# Patient Record
Sex: Female | Born: 1960 | Race: White | Hispanic: No | Marital: Married | Smoking: Never smoker
Health system: Southern US, Community
[De-identification: ages and names within clinical notes are randomized; demographics above are authoritative.]

## PROBLEM LIST (undated history)

## (undated) DIAGNOSIS — R918 Other nonspecific abnormal finding of lung field: Secondary | ICD-10-CM

## (undated) DIAGNOSIS — F329 Major depressive disorder, single episode, unspecified: Secondary | ICD-10-CM

## (undated) DIAGNOSIS — F419 Anxiety disorder, unspecified: Secondary | ICD-10-CM

## (undated) DIAGNOSIS — I10 Essential (primary) hypertension: Secondary | ICD-10-CM

## (undated) DIAGNOSIS — E785 Hyperlipidemia, unspecified: Secondary | ICD-10-CM

## (undated) DIAGNOSIS — I2510 Atherosclerotic heart disease of native coronary artery without angina pectoris: Secondary | ICD-10-CM

## (undated) DIAGNOSIS — G894 Chronic pain syndrome: Secondary | ICD-10-CM

## (undated) DIAGNOSIS — F32A Depression, unspecified: Secondary | ICD-10-CM

## (undated) DIAGNOSIS — K219 Gastro-esophageal reflux disease without esophagitis: Secondary | ICD-10-CM

## (undated) DIAGNOSIS — G587 Mononeuritis multiplex: Secondary | ICD-10-CM

## (undated) DIAGNOSIS — I471 Supraventricular tachycardia: Secondary | ICD-10-CM

## (undated) DIAGNOSIS — N200 Calculus of kidney: Secondary | ICD-10-CM

## (undated) DIAGNOSIS — R569 Unspecified convulsions: Secondary | ICD-10-CM

## (undated) DIAGNOSIS — J189 Pneumonia, unspecified organism: Secondary | ICD-10-CM

## (undated) DIAGNOSIS — I4719 Other supraventricular tachycardia: Secondary | ICD-10-CM

## (undated) DIAGNOSIS — E119 Type 2 diabetes mellitus without complications: Secondary | ICD-10-CM

## (undated) HISTORY — DX: Essential (primary) hypertension: I10

## (undated) HISTORY — DX: Mononeuritis multiplex: G58.7

## (undated) HISTORY — DX: Supraventricular tachycardia: I47.1

## (undated) HISTORY — DX: Depression, unspecified: F32.A

## (undated) HISTORY — DX: Calculus of kidney: N20.0

## (undated) HISTORY — DX: Atherosclerotic heart disease of native coronary artery without angina pectoris: I25.10

## (undated) HISTORY — DX: Pneumonia, unspecified organism: J18.9

## (undated) HISTORY — DX: Hyperlipidemia, unspecified: E78.5

## (undated) HISTORY — DX: Type 2 diabetes mellitus without complications: E11.9

## (undated) HISTORY — DX: Gastro-esophageal reflux disease without esophagitis: K21.9

## (undated) HISTORY — PX: OTHER SURGICAL HISTORY: SHX169

## (undated) HISTORY — DX: Chronic pain syndrome: G89.4

## (undated) HISTORY — DX: Other supraventricular tachycardia: I47.19

## (undated) HISTORY — DX: Other nonspecific abnormal finding of lung field: R91.8

## (undated) HISTORY — PX: ABDOMINAL HYSTERECTOMY: SUR658

## (undated) HISTORY — DX: Anxiety disorder, unspecified: F41.9

## (undated) HISTORY — DX: Major depressive disorder, single episode, unspecified: F32.9

## (undated) HISTORY — PX: CHOLECYSTECTOMY: SHX55

---

## 2002-08-18 ENCOUNTER — Emergency Department (HOSPITAL_COMMUNITY): Admission: EM | Admit: 2002-08-18 | Discharge: 2002-08-18 | Payer: Self-pay | Admitting: *Deleted

## 2005-05-22 ENCOUNTER — Ambulatory Visit: Payer: Self-pay | Admitting: Cardiology

## 2005-05-31 ENCOUNTER — Ambulatory Visit: Payer: Self-pay | Admitting: Cardiology

## 2005-06-05 ENCOUNTER — Ambulatory Visit: Payer: Self-pay | Admitting: Cardiology

## 2007-01-15 ENCOUNTER — Ambulatory Visit: Payer: Self-pay | Admitting: Cardiology

## 2007-01-18 ENCOUNTER — Inpatient Hospital Stay (HOSPITAL_COMMUNITY): Admission: EM | Admit: 2007-01-18 | Discharge: 2007-01-21 | Payer: Self-pay | Admitting: Emergency Medicine

## 2007-01-18 ENCOUNTER — Ambulatory Visit: Payer: Self-pay | Admitting: Cardiology

## 2007-02-05 ENCOUNTER — Ambulatory Visit: Payer: Self-pay | Admitting: Cardiology

## 2007-03-31 ENCOUNTER — Ambulatory Visit: Payer: Self-pay | Admitting: Cardiology

## 2007-04-01 ENCOUNTER — Observation Stay (HOSPITAL_COMMUNITY): Admission: AD | Admit: 2007-04-01 | Discharge: 2007-04-02 | Payer: Self-pay | Admitting: Cardiology

## 2007-04-01 ENCOUNTER — Ambulatory Visit: Payer: Self-pay | Admitting: Cardiology

## 2007-04-08 ENCOUNTER — Ambulatory Visit: Payer: Self-pay | Admitting: Cardiology

## 2007-04-14 ENCOUNTER — Ambulatory Visit: Payer: Self-pay | Admitting: Family Medicine

## 2008-07-15 ENCOUNTER — Ambulatory Visit: Payer: Self-pay | Admitting: Cardiology

## 2008-07-16 ENCOUNTER — Encounter: Payer: Self-pay | Admitting: Cardiology

## 2008-08-25 ENCOUNTER — Ambulatory Visit: Payer: Self-pay | Admitting: Cardiology

## 2008-08-31 ENCOUNTER — Ambulatory Visit: Payer: Self-pay | Admitting: Cardiology

## 2008-09-27 ENCOUNTER — Ambulatory Visit: Payer: Self-pay | Admitting: Internal Medicine

## 2008-10-01 ENCOUNTER — Encounter: Payer: Self-pay | Admitting: Internal Medicine

## 2008-10-08 ENCOUNTER — Ambulatory Visit: Payer: Self-pay | Admitting: Internal Medicine

## 2008-10-08 ENCOUNTER — Ambulatory Visit (HOSPITAL_COMMUNITY): Admission: RE | Admit: 2008-10-08 | Discharge: 2008-10-08 | Payer: Self-pay | Admitting: Internal Medicine

## 2008-11-05 ENCOUNTER — Ambulatory Visit: Payer: Self-pay | Admitting: Internal Medicine

## 2008-11-05 ENCOUNTER — Encounter: Payer: Self-pay | Admitting: Internal Medicine

## 2008-11-05 DIAGNOSIS — I471 Supraventricular tachycardia: Secondary | ICD-10-CM

## 2008-11-05 DIAGNOSIS — I2510 Atherosclerotic heart disease of native coronary artery without angina pectoris: Secondary | ICD-10-CM

## 2008-11-25 DIAGNOSIS — I498 Other specified cardiac arrhythmias: Secondary | ICD-10-CM | POA: Insufficient documentation

## 2008-11-26 ENCOUNTER — Encounter: Payer: Self-pay | Admitting: Internal Medicine

## 2008-11-26 ENCOUNTER — Ambulatory Visit: Payer: Self-pay | Admitting: Internal Medicine

## 2009-01-06 ENCOUNTER — Encounter: Payer: Self-pay | Admitting: Cardiology

## 2009-01-18 ENCOUNTER — Ambulatory Visit: Payer: Self-pay | Admitting: Cardiology

## 2009-02-10 ENCOUNTER — Ambulatory Visit: Payer: Self-pay | Admitting: Internal Medicine

## 2009-02-10 ENCOUNTER — Inpatient Hospital Stay (HOSPITAL_COMMUNITY): Admission: EM | Admit: 2009-02-10 | Discharge: 2009-02-12 | Payer: Self-pay | Admitting: Emergency Medicine

## 2009-02-19 ENCOUNTER — Ambulatory Visit: Payer: Self-pay | Admitting: Cardiovascular Disease

## 2009-02-20 ENCOUNTER — Inpatient Hospital Stay (HOSPITAL_COMMUNITY): Admission: EM | Admit: 2009-02-20 | Discharge: 2009-02-21 | Payer: Self-pay | Admitting: Emergency Medicine

## 2009-02-21 ENCOUNTER — Encounter: Payer: Self-pay | Admitting: Cardiology

## 2009-03-04 ENCOUNTER — Telehealth: Payer: Self-pay | Admitting: Internal Medicine

## 2009-03-05 ENCOUNTER — Telehealth (INDEPENDENT_AMBULATORY_CARE_PROVIDER_SITE_OTHER): Payer: Self-pay | Admitting: Physician Assistant

## 2009-03-09 ENCOUNTER — Encounter: Payer: Self-pay | Admitting: Physician Assistant

## 2009-03-09 ENCOUNTER — Ambulatory Visit: Payer: Self-pay | Admitting: Cardiology

## 2009-03-16 ENCOUNTER — Telehealth: Payer: Self-pay | Admitting: Internal Medicine

## 2009-03-23 ENCOUNTER — Ambulatory Visit: Payer: Self-pay | Admitting: Internal Medicine

## 2009-04-01 ENCOUNTER — Telehealth: Payer: Self-pay | Admitting: Internal Medicine

## 2009-04-10 ENCOUNTER — Ambulatory Visit: Payer: Self-pay | Admitting: Internal Medicine

## 2009-04-10 ENCOUNTER — Inpatient Hospital Stay (HOSPITAL_COMMUNITY): Admission: EM | Admit: 2009-04-10 | Discharge: 2009-04-12 | Payer: Self-pay | Admitting: Emergency Medicine

## 2009-05-04 ENCOUNTER — Encounter (INDEPENDENT_AMBULATORY_CARE_PROVIDER_SITE_OTHER): Payer: Self-pay | Admitting: *Deleted

## 2009-05-06 ENCOUNTER — Encounter: Payer: Self-pay | Admitting: Physician Assistant

## 2009-05-06 ENCOUNTER — Ambulatory Visit: Payer: Self-pay | Admitting: Cardiology

## 2009-05-06 DIAGNOSIS — R0789 Other chest pain: Secondary | ICD-10-CM | POA: Insufficient documentation

## 2009-05-06 DIAGNOSIS — E785 Hyperlipidemia, unspecified: Secondary | ICD-10-CM

## 2009-07-18 ENCOUNTER — Telehealth (INDEPENDENT_AMBULATORY_CARE_PROVIDER_SITE_OTHER): Payer: Self-pay | Admitting: *Deleted

## 2009-07-19 ENCOUNTER — Ambulatory Visit: Payer: Self-pay | Admitting: Cardiology

## 2009-07-20 ENCOUNTER — Ambulatory Visit: Payer: Self-pay | Admitting: Cardiology

## 2009-07-20 ENCOUNTER — Inpatient Hospital Stay (HOSPITAL_COMMUNITY): Admission: AD | Admit: 2009-07-20 | Discharge: 2009-07-22 | Payer: Self-pay | Admitting: Cardiology

## 2009-07-20 ENCOUNTER — Encounter: Payer: Self-pay | Admitting: Cardiology

## 2009-07-21 ENCOUNTER — Encounter: Payer: Self-pay | Admitting: Cardiology

## 2009-07-22 ENCOUNTER — Encounter: Payer: Self-pay | Admitting: Cardiology

## 2009-08-17 ENCOUNTER — Ambulatory Visit: Payer: Self-pay | Admitting: Cardiology

## 2009-08-17 DIAGNOSIS — E109 Type 1 diabetes mellitus without complications: Secondary | ICD-10-CM | POA: Insufficient documentation

## 2009-08-22 ENCOUNTER — Ambulatory Visit: Payer: Self-pay | Admitting: Internal Medicine

## 2009-12-09 ENCOUNTER — Encounter: Payer: Self-pay | Admitting: Internal Medicine

## 2009-12-13 ENCOUNTER — Encounter: Payer: Self-pay | Admitting: Internal Medicine

## 2009-12-15 ENCOUNTER — Encounter: Payer: Self-pay | Admitting: Internal Medicine

## 2009-12-20 ENCOUNTER — Ambulatory Visit: Payer: Self-pay | Admitting: Cardiology

## 2009-12-20 ENCOUNTER — Encounter: Payer: Self-pay | Admitting: Cardiology

## 2010-02-23 ENCOUNTER — Ambulatory Visit: Payer: Self-pay | Admitting: Cardiology

## 2010-03-03 ENCOUNTER — Encounter: Payer: Self-pay | Admitting: Cardiology

## 2010-03-13 ENCOUNTER — Encounter (INDEPENDENT_AMBULATORY_CARE_PROVIDER_SITE_OTHER): Payer: Self-pay | Admitting: *Deleted

## 2010-03-23 ENCOUNTER — Ambulatory Visit: Payer: Self-pay | Admitting: Internal Medicine

## 2010-04-10 ENCOUNTER — Emergency Department (HOSPITAL_COMMUNITY): Admission: EM | Admit: 2010-04-10 | Discharge: 2010-04-10 | Payer: Self-pay | Admitting: Emergency Medicine

## 2010-05-09 IMAGING — CR DG CHEST 2V
2 series · 2 of 2 positions shown · non-contrast
Comparison: 01/18/2007

CLINICAL DATA: Left chest and arm pain, history coronary artery
stent

CHEST - 2 VIEW

[w chest pa]
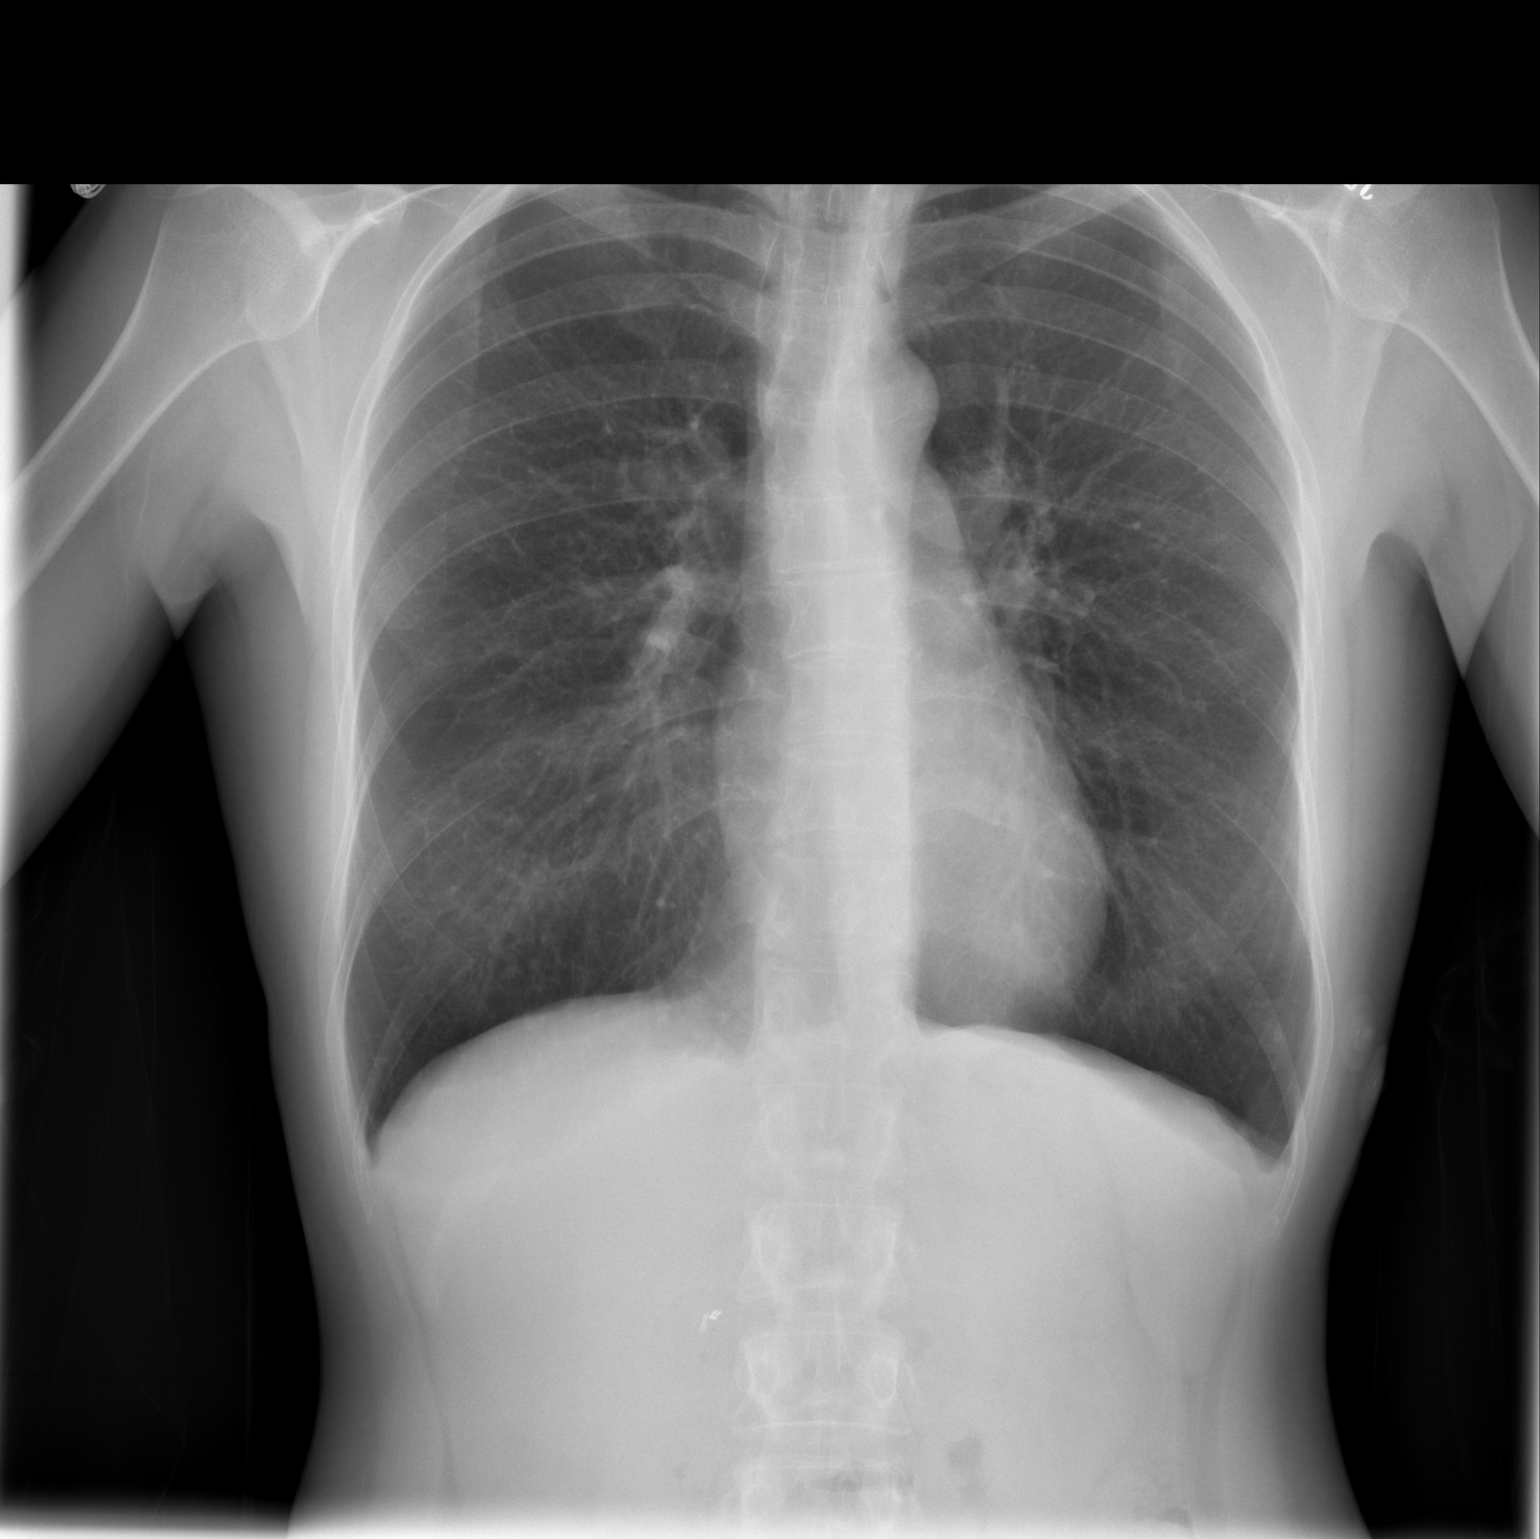

[w chest lat]
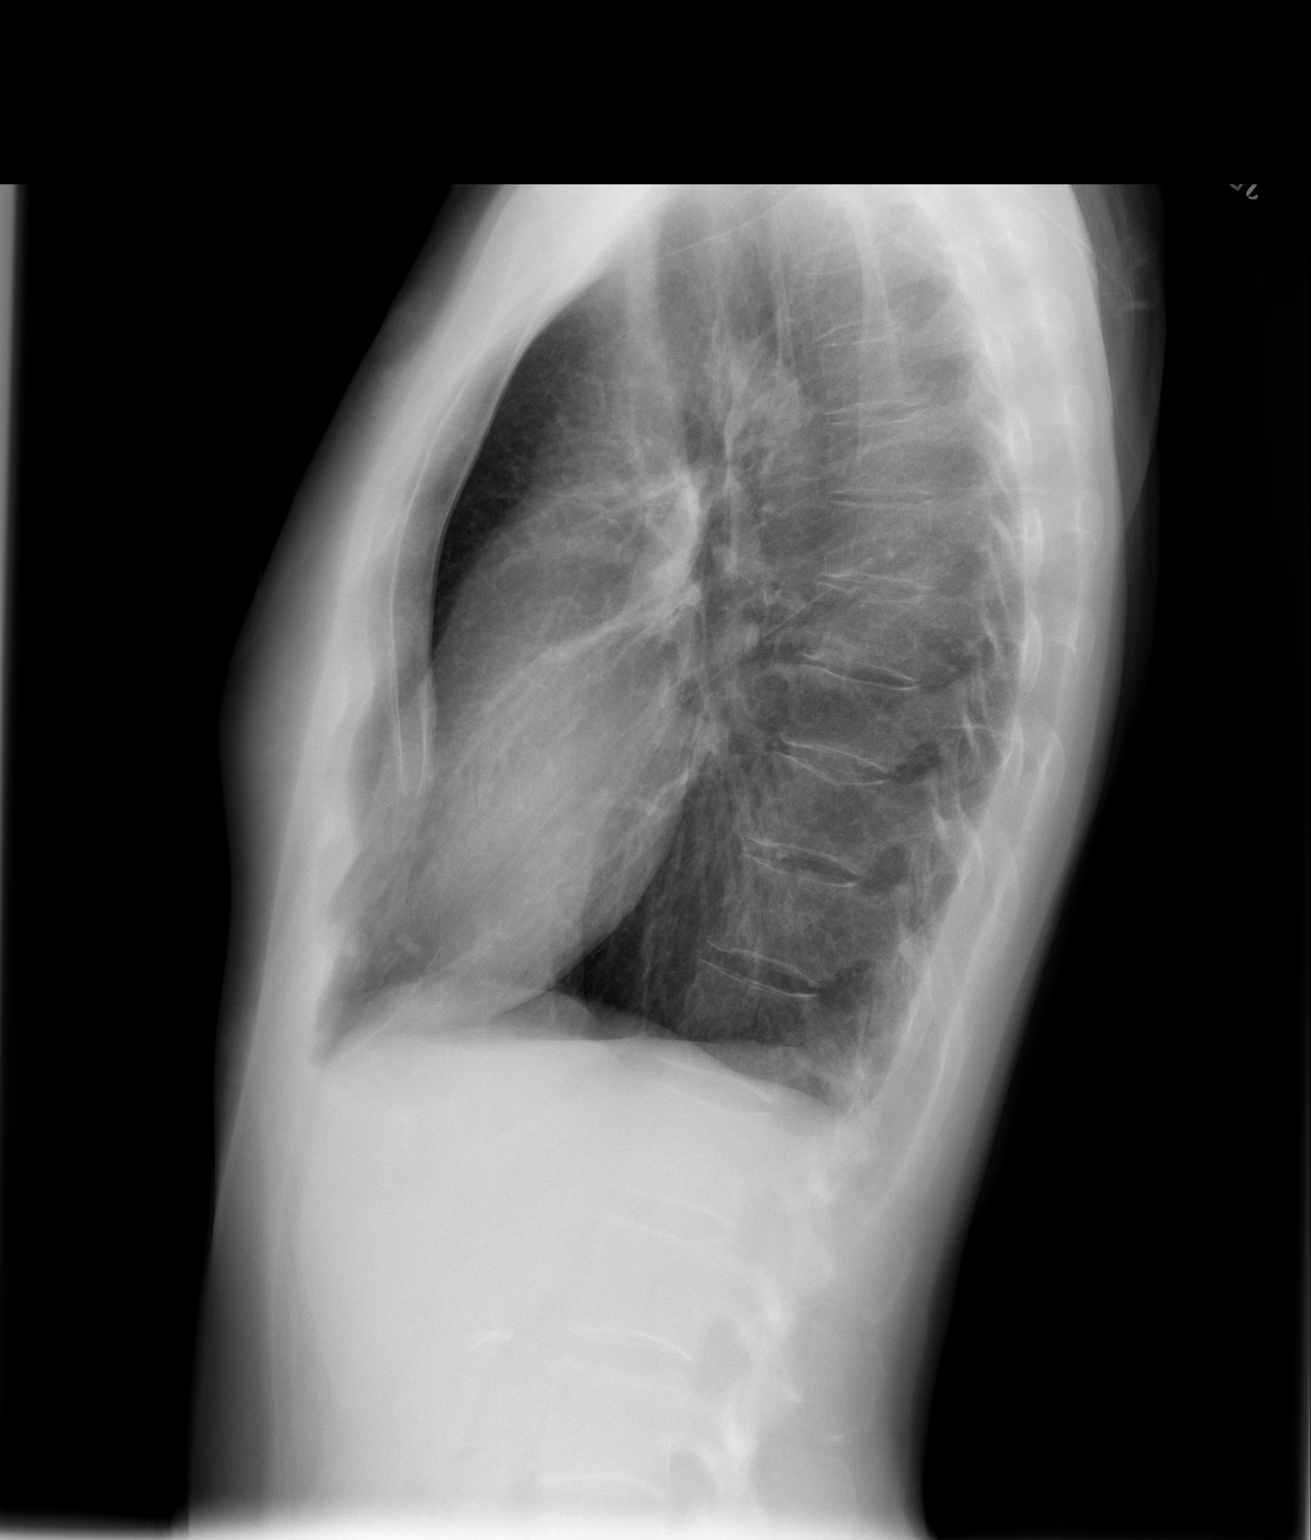

[2 of 2 positions shown; findings below may reference images not displayed]

FINDINGS: Normal heart size, mediastinal contours, and pulmonary vascularity.
Mild emphysematous changes without infiltrate or effusion.
No pneumothorax.
Question mild bony demineralization.
Faint nodular foci at the upper lobes and left base correspond to
EKG leads.
IMPRESSION: Mild emphysematous changes.
No acute abnormalities.

## 2010-06-05 ENCOUNTER — Ambulatory Visit: Payer: Self-pay | Admitting: Internal Medicine

## 2010-07-29 ENCOUNTER — Encounter: Payer: Self-pay | Admitting: Internal Medicine

## 2010-07-30 ENCOUNTER — Encounter: Payer: Self-pay | Admitting: Internal Medicine

## 2010-07-30 ENCOUNTER — Encounter: Payer: Self-pay | Admitting: Cardiology

## 2010-08-01 ENCOUNTER — Ambulatory Visit: Payer: Self-pay | Admitting: Cardiology

## 2010-08-01 ENCOUNTER — Encounter: Payer: Self-pay | Admitting: Internal Medicine

## 2010-08-02 ENCOUNTER — Encounter: Payer: Self-pay | Admitting: Internal Medicine

## 2010-08-03 ENCOUNTER — Encounter: Payer: Self-pay | Admitting: Internal Medicine

## 2010-08-09 ENCOUNTER — Encounter: Payer: Self-pay | Admitting: Internal Medicine

## 2010-10-17 NOTE — Procedures (Signed)
Summary: Event  Event   Imported By: Marylou Mccoy 04/11/2010 12:01:41  _____________________________________________________________________  External Attachment:    Type:   Image     Comment:   External Document

## 2010-10-17 NOTE — Consult Note (Signed)
Summary: CARDIOLOGY CONSULT/ [facility]  CARDIOLOGY CONSULT/ [facility]   Imported By: Zachary George 02/23/2010 15:37:33  _____________________________________________________________________  External Attachment:    Type:   Image     Comment:   External Document

## 2010-10-17 NOTE — Assessment & Plan Note (Signed)
Summary: 6 month fu-recv reminder-vs   Visit Type:  Follow-up Primary Provider:  Dr Doreen Beam   History of Present Illness: 50 year old woman presents for follow-up.  Since last seen in the office, she was seen in consultation by Dr. Myrtis Ser at [facility] back in April with chest pain.  She ruled out for MI at that time and was menaged medically.  Labs from April showed BUN 14, creatinine 0.9, potassium 3.8, normal troponin levels, hemoglobin 13.9.  CXR revealed evidence of COPD without acute changes.  She denies any problems with anginal chest pain. She does report an increase in her palpitations over the last several weeks, sometimes using Cardizem 30 mg p.o. q.6 hours up to 3 times a day. She's had no obvious dizziness or clear systematic bradycardia. Today's heart rate and electrocardiogram are reviewed below.  I reviewed her medications including present simvastatin dose with Cardizem. This will be reduced, with followup lipid profile and liver function tests.  Preventive Screening-Counseling & Management  Alcohol-Tobacco     Smoking Status: never  Current Medications (verified): 1)  Simvastatin 40 Mg Tabs (Simvastatin) .... Take One Tablet By Mouth Daily At Bedtime 2)  Aspirin 81 Mg Tbec (Aspirin) .... Take One Tablet By Mouth Daily 3)  Prilosec 20 Mg Cpdr (Omeprazole) .... Take 1 Capsule By Mouth Once A Day 4)  Humulin 70/30 70-30 % Susp (Insulin Isophane & Reg (Human)) .... Inject 20 Units Subcutaneously Each Am and 10 Units Each Pm 5)  Cardizem 30 Mg Tabs (Diltiazem Hcl) .... As Needed Palpitations 6)  Nitrostat 0.4 Mg Subl (Nitroglycerin) .... Place 1 Tablet Under Tongue As Directed 7)  Plavix 75 Mg Tabs (Clopidogrel Bisulfate) .... Take One Tablet By Mouth Daily 8)  Celexa 40 Mg Tabs (Citalopram Hydrobromide) .... Take 1 Tablet By Mouth Once A Day 9)  Gabapentin 300 Mg Caps (Gabapentin) .... Take 1 Tablet By Mouth Two Times A Day 10)  Ambien 5 Mg Tabs (Zolpidem Tartrate) .... Take 1  Tablet By Mouth Once A Day At Bedtime 11)  Percocet 7.5-325 Mg Tabs (Oxycodone-Acetaminophen) .... Take 1 Tablet By Mouth Three Times A Day As Needed  Allergies (verified): 1)  ! Ampicillin 2)  ! * No Ivp Dye Allergy 3)  ! * No Shellfish Allergy 4)  ! * No Latex Allergy  Comments:  Nurse/Medical Assistant: The patient's medication list and allergies were reviewed with the patient and were updated in the Medication and Allergy Lists.  Past History:  Social History: Last updated: 02/23/2010 Lives in [place]  She has two children Disabled  Married  Tobacco Use - No Alcohol Use - no  Past Medical History: CAD - DES mid RCA 5/08 and DES mid RCA 5/10, LVEF normal Chronic pain syndrome Mononeuritis multiplex Atrial tachycardia and history of bradycardia, on long-acting diltiazem G E R D Bilateral nephrolithiasis Bilateral pulmonary nodules Pneumonia Diabetes Type 2 Hyperlipidemia  Past Surgical History: Cholecystectomy Total hysterectomy Right knee surgery  Family History: Father: died with COPD Mother: died in her 81's with MI Siblings: no CAD  Social History: Lives in [place]  She has two children Disabled  Married  Tobacco Use - No Alcohol Use - no  Review of Systems       The patient complains of dyspnea on exertion.  The patient denies anorexia, fever, chest pain, syncope, peripheral edema, prolonged cough, headaches, melena, and hematochezia.         Otherwise reviewed and negative.  Vital Signs:  Patient profile:  50 year old female Height:      67 inches Weight:      114 pounds Pulse rate:   60 / minute BP sitting:   111 / 68  (left arm) Cuff size:   regular  Vitals Entered By: Carlye Grippe (February 23, 2010 1:40 PM)  Physical Exam  Additional Exam:  Thin woman in no acute distress. HEENT: Conjunctiva and lids normal, oropharynx clear with poor dentition. Neck: Supple, no elevated JVP or bruits. Lungs: Clear to auscultation,  diminished breath sounds, nonlabored. Cardiac: Regular rate and rhythm, no S3 loud systolic murmur. Abdomen: Soft, nontender, bowel sounds present. Skin: Tan, warm and dry. Extremities: No pitting edema, distal pulses are 1-2+. Musculoskeletal: No kyphosis. Neuropsychiatric: Alert and oriented x3, affect grossly appropriate.   Cardiac Cath  Procedure date:  07/21/2009  Findings:      HEMODYNAMIC DATA:   1. The central aortic pressure was 119/57.  The mean was 82.   2. Left ventricular pressure was 135/6.   3. There was no gradient or pullback across the aortic valve.      ANGIOGRAPHIC DATA:   1. Ventriculography done in the RAO projection reveals preserved       global systolic function.  No segmental abnormalities, contraction       were identified.   2. The right coronary artery is a small to moderate sized vessel,       provides a single PDA and a very small posterolateral system.  The       right coronary artery demonstrates a previously placed stent with a       second stent.  Overlapping within the course of the stent, there       was less than 10% narrowing and no high-grade stenosis.   3. The left main is free of critical disease.   4. The LAD courses to the apex and provides a diagonal branch of the       LAD and its branches are free of significant disease.   5. The circumflex is a moderate-sized vessel.  There is minimal ostial       irregularity, but nothing that is high grade.  The remainder of the       vessel is without significant narrowing.  EKG  Procedure date:  02/23/2010  Findings:      Sinus bradycardia 56 beats per minute, possible right atrial enlargement.  Impression & Recommendations:  Problem # 1:  ATRIAL TACHYCARDIA (ICD-427.89)  Patient reports increasing palpitations over the last several weeks, using p.r.n. Cardizem as noted above more frequently, sometimes 3 times a day. Her electrocardiogram today is stable however with sinus bradycardia.  She is due for a followup visit with Dr. Johney Frame in the next few weeks. May need to consider either placing her on a low-dose long-acting Cardizem formulation, versus potentially an antiarrhythmic.  Her updated medication list for this problem includes:    Aspirin 81 Mg Tbec (Aspirin) .Marland Kitchen... Take one tablet by mouth daily    Cardizem 30 Mg Tabs (Diltiazem hcl) .Marland Kitchen... As needed palpitations    Nitrostat 0.4 Mg Subl (Nitroglycerin) .Marland Kitchen... Place 1 tablet under tongue as directed    Plavix 75 Mg Tabs (Clopidogrel bisulfate) .Marland Kitchen... Take one tablet by mouth daily  Orders: EKG w/ Interpretation (93000)  Problem # 2:  CAD, NATIVE VESSEL (ICD-414.01)  No recent anginal chest pain reported. She did have a hospital admission back in April at which time she ruled out for myocardial  infarction. No change in antianginal regimen. I will see her back in 6 months.  Her updated medication list for this problem includes:    Aspirin 81 Mg Tbec (Aspirin) .Marland Kitchen... Take one tablet by mouth daily    Cardizem 30 Mg Tabs (Diltiazem hcl) .Marland Kitchen... As needed palpitations    Nitrostat 0.4 Mg Subl (Nitroglycerin) .Marland Kitchen... Place 1 tablet under tongue as directed    Plavix 75 Mg Tabs (Clopidogrel bisulfate) .Marland Kitchen... Take one tablet by mouth daily  Orders: EKG w/ Interpretation (93000)  Problem # 3:  DYSLIPIDEMIA (ICD-272.4)  Plan followup fasting lipid profile and liver function tests. Will reduce simvastatin to 10 mg daily in light of increasing need for Cardizem. Plan followup labs around the time of her next visit.  Her updated medication list for this problem includes:    Simvastatin 10 Mg Tabs (Simvastatin) .Marland Kitchen... Take one tablet by mouth daily at bedtime  Orders: T-Lipid Profile ([id number]) T-Hepatic Function [phone])  Patient Instructions: 1)  Your physician wants you to follow-up in: 6 months. You will receive a reminder letter in the mail one-two months in advance. If you don't receive a letter, please call our  office to schedule the follow-up appointment. 2)  Decrease Simvastatin (Zocor) to 10mg  by mouth at bedtime. 3)  Your physician recommends that you go to the [facility] for a FASTING lipid profile and liver function labs. Do not eat or drink after midnight. If the results of your test are normal or stable, you will receive a letter. If they are abnormal, the nurse will contact you by phone. 4)  Keep appt with Dr. Johney Frame on Thursday, July 7th at [facility] in [place]. Prescriptions: SIMVASTATIN 10 MG TABS (SIMVASTATIN) Take one tablet by mouth daily at bedtime  #30 x 6   Entered by:   Cyril Loosen, RN, BSN   Authorized by:   Loreli Slot, MD, [facility]   Signed by:   Cyril Loosen, RN, BSN on 02/23/2010   Method used:   Electronically to        [facility]  [address] (retail)       [address]       [place], [place]  [address]       Ph: [phone]       Fax: [phone]   RxID:   [id number]

## 2010-10-17 NOTE — Procedures (Signed)
Summary: Event  Event   Imported By: Marylou Mccoy 04/11/2010 11:53:30  _____________________________________________________________________  External Attachment:    Type:   Image     Comment:   External Document

## 2010-10-17 NOTE — Procedures (Signed)
Summary: CardioNet/Event monitoring  CardioNet/Event monitoring   Imported By: Erle Crocker 04/12/2010 08:43:28  _____________________________________________________________________  External Attachment:    Type:   Image     Comment:   External Document

## 2010-10-17 NOTE — Letter (Signed)
Summary: Engineer, materials at [facility]  [address]   [place], [place] [address]   Phone: [phone]  Fax: [phone]        March 13, 2010 MRN: [id number]    [facility] [address] [place], [place]  [address]    Dear Ms. Boulet,  Your test ordered by Selena Batten has been reviewed by your physician (or physician assistant) and was found to be normal or stable. Your physician (or physician assistant) felt no changes were needed at this time.  ____ Echocardiogram  ____ Cardiac Stress Test  __X__ Lab Work  ____ Peripheral vascular study of arms, legs or neck  ____ CT scan or X-ray  ____ Lung or Breathing test  ____ Other:   Thank you.   Cyril Loosen, RN, BSN    Duane Boston, M.D., F.A.C.C. Thressa Sheller, M.D., F.A.C.C. Oneal Grout, M.D., F.A.C.C. Cheree Ditto, M.D., F.A.C.C. Daiva Nakayama, M.D., F.A.C.C. Kenney Houseman, M.D., F.A.C.C. Jeanne Ivan, PA-C

## 2010-10-17 NOTE — Assessment & Plan Note (Signed)
Summary: per check out/sf   Visit Type:  Follow-up Referring Provider:  Dr Simona Huh Primary Provider:  Dr Doreen Beam  CC:  palpitations.  History of Present Illness: The patient presents today for routine electrophysiology followup. She is primarily concerned with social stress and anxiety today.  She and her husband continue to have marital problems.  She also reports that her brother lives with them and she cannot "evict" him.  She has had to call the police to try to have him removed from the house. She reports episodes of anxiety with chest tightness and heart racing.  She finds that these episodes occur several times per week.  She reports gradual onset and termination of her symptoms.  She finds some relief with as needed cardizem. The patient denies symptoms of shortness of breath, orthopnea, PND, lower extremity edema, dizziness, presyncope, syncope, or neurologic sequela. The patient is tolerating medications without difficulties and is otherwise without complaint today.   Current Medications (verified): 1)  Simvastatin 10 Mg Tabs (Simvastatin) .... Take One Tablet By Mouth Daily At Bedtime 2)  Aspirin 81 Mg Tbec (Aspirin) .... Take One Tablet By Mouth Daily 3)  Prilosec 20 Mg Cpdr (Omeprazole) .... Take 1 Capsule By Mouth Once A Day 4)  Humulin 70/30 70-30 % Susp (Insulin Isophane & Reg (Human)) .... Inject 20 Units Subcutaneously Each Am and 10 Units Each Pm 5)  Cardizem 30 Mg Tabs (Diltiazem Hcl) .... As Needed Palpitations 6)  Nitrostat 0.4 Mg Subl (Nitroglycerin) .... Place 1 Tablet Under Tongue As Directed 7)  Plavix 75 Mg Tabs (Clopidogrel Bisulfate) .... Take One Tablet By Mouth Daily 8)  Celexa 40 Mg Tabs (Citalopram Hydrobromide) .... Take 1 Tablet By Mouth Once A Day 9)  Gabapentin 300 Mg Caps (Gabapentin) .... Take 1 Tablet By Mouth Two Times A Day 10)  Ambien 5 Mg Tabs (Zolpidem Tartrate) .... Take 1 Tablet By Mouth Once A Day At Bedtime 11)  Percocet 7.5-325 Mg  Tabs (Oxycodone-Acetaminophen) .... Take 1 Tablet By Mouth Three Times A Day As Needed  Allergies: 1)  ! Ampicillin  Past History:  Past Medical History: CAD - DES mid RCA 5/08 and DES mid RCA 5/10, LVEF normal Chronic pain syndrome Mononeuritis multiplex Atrial tachycardia and history of bradycardia, on long-acting diltiazem G E R D Bilateral nephrolithiasis Bilateral pulmonary nodules Pneumonia Diabetes Type 2 Hyperlipidemia Anxiety  Past Surgical History: Reviewed history from 02/23/2010 and no changes required. Cholecystectomy Total hysterectomy Right knee surgery  Social History: Reviewed history from 02/23/2010 and no changes required. Lives in [place]  She has two children Disabled  Married  Tobacco Use - No Alcohol Use - no  Review of Systems       All systems are reviewed and negative except as listed in the HPI.   Vital Signs:  Patient profile:   50 year old female Height:      67 inches Weight:      117.25 pounds BMI:     18.43 Pulse rate:   53 / minute Pulse rhythm:   regular Resp:     18 per minute BP sitting:   118 / 60  (left arm) Cuff size:   large  Vitals Entered By: Vikki Ports (March 23, 2010 11:10 AM)  Physical Exam  General:  anxious, NAD Head:  normocephalic and atraumatic Eyes:  PERRLA/EOM intact; conjunctiva and lids normal. Mouth:  Teeth, gums and palate normal. Oral mucosa normal. Neck:  Neck supple, no JVD. No  masses, thyromegaly or abnormal cervical nodes. Lungs:  Clear bilaterally to auscultation and percussion. Heart:  Non-displaced PMI, chest non-tender; regular rate and rhythm, S1, S2 without murmurs, rubs or gallops. Carotid upstroke normal, no bruit. Normal abdominal aortic size, no bruits. Femorals normal pulses, no bruits. Pedals normal pulses. No edema, no varicosities. Abdomen:  Bowel sounds positive; abdomen soft and non-tender without masses, organomegaly, or hernias noted. No hepatosplenomegaly. Msk:  Back  normal, normal gait. Muscle strength and tone normal. Pulses:  pulses normal in all 4 extremities Extremities:  No clubbing or cyanosis. Neurologic:  Alert and oriented x 3.   EKG  Procedure date:  03/23/2010  Findings:      sinus bradycardia 53 bpm, PR 120, Qtc , otherwise normal ekg  EKG  Procedure date:  12/08/2009  Findings:      The patient wore a cardioNet monitor 12/08/09-12/22/09 which documented predominantly sinus rhythm and sinus bradycardia.  I have reviewed the portion of the monitor faxed from Dr Sherril Croon' office to our office.  I do not see any sustained SVT.  She did have several short runs (4-5 beats) of nonsustained atrial tachycardia.  Impression & Recommendations:  Problem # 1:  ATRIAL TACHYCARDIA (ICD-427.89) The patient continues to have palpitations and chest tightness.  I suspect that some of these episodes are sinus mechanism and related to anxiety/ psychosocial stress.  She most recently had an event monitor placed by Dr Sherril Croon which documented sinus rhythm and sinus bradycardia.  Upon the review of the portion of the monitor faxed to our office, I did not see any sustained SVT.  She did have short (3-4 beats) of nonsustained atrial tachycardia similar to her EP study.  Unfortunately, these nonsustained episodes are not amenable to mapping and ablation. Given her bradycardia, I have not started daily beta blockers or calcium channel blockers.  She continues to find some relief with as needed cardizem. We will consider either multaq, sotalol, or tikosyn if she has documented worsening and prolongation of SVT.  Otherwise, I would propose that we continue our present strategy.  Problem # 2:  CAD, NATIVE VESSEL (ICD-414.01) stable no changes  Problem # 3:  BRADYCARDIA (ICD-427.89) asymptomatic  no changes today  Patient Instructions: 1)  no medicine changes today

## 2010-10-17 NOTE — Assessment & Plan Note (Signed)
Summary: f61m/ gd   Visit Type:  Follow-up Referring Provider:  Dr Simona Huh Primary Provider:  Dr Doreen Beam   History of Present Illness: The patient presents today for routine electrophysiology followup. She reports doing very well since last being seen in our clinic, this is mostly due to improvements in psychosocial stress improvements related to her husband. The patient denies symptoms of shortness of breath, orthopnea, PND, lower extremity edema, presyncope, syncope, or neurologic sequela.  She reports rare episodes of dizziness at night when going to the bathroom.  She also feels that her palpitations and chest pain are currently well controlled.  The patient is tolerating medications without difficulties and is otherwise without complaint today.   Current Medications (verified): 1)  Simvastatin 10 Mg Tabs (Simvastatin) .... Take One Tablet By Mouth Daily At Bedtime 2)  Aspirin 81 Mg Tbec (Aspirin) .... Take One Tablet By Mouth Daily 3)  Prilosec 20 Mg Cpdr (Omeprazole) .... Take 1 Capsule By Mouth Once A Day 4)  Humulin 70/30 70-30 % Susp (Insulin Isophane & Reg (Human)) .... Inject 20 Units Subcutaneously Each Am and 10 Units Each Pm 5)  Cardizem 30 Mg Tabs (Diltiazem Hcl) .... As Needed Palpitations 6)  Nitrostat 0.4 Mg Subl (Nitroglycerin) .... Place 1 Tablet Under Tongue As Directed 7)  Plavix 75 Mg Tabs (Clopidogrel Bisulfate) .... Take One Tablet By Mouth Daily 8)  Celexa 20 Mg Tabs (Citalopram Hydrobromide) .... Take One Tablet By Mouth Once Daily. 9)  Gabapentin 300 Mg Caps (Gabapentin) .... Take 1 Tablet By Mouth Two Times A Day 10)  Ambien 5 Mg Tabs (Zolpidem Tartrate) .... Take 1 Tablet By Mouth Once A Day At Bedtime 11)  Atrovent Hfa 17 Mcg/act Aers (Ipratropium Bromide Hfa) .... Uad 12)  Tizanidine Hcl 2 Mg Tabs (Tizanidine Hcl) .... Two Times A Day  Allergies: 1)  ! Ampicillin  Past History:  Past Medical History: Reviewed history from 03/23/2010 and no changes  required. CAD - DES mid RCA 5/08 and DES mid RCA 5/10, LVEF normal Chronic pain syndrome Mononeuritis multiplex Atrial tachycardia and history of bradycardia, on long-acting diltiazem G E R D Bilateral nephrolithiasis Bilateral pulmonary nodules Pneumonia Diabetes Type 2 Hyperlipidemia Anxiety  Past Surgical History: Reviewed history from 02/23/2010 and no changes required. Cholecystectomy Total hysterectomy Right knee surgery  Social History: Reviewed history from 02/23/2010 and no changes required. Lives in [place]  She has two children Disabled  Married  Tobacco Use - No Alcohol Use - no  Review of Systems       All systems are reviewed and negative except as listed in the HPI.   Vital Signs:  Patient profile:   50 year old female Height:      67 inches Weight:      120 pounds BMI:     18.86 Pulse rate:   51 / minute BP sitting:   122 / 70  (left arm)  Vitals Entered By: Laurance Flatten CMA (June 05, 2010 10:39 AM)  Physical Exam  General:  NAD, flat affect today Head:  normocephalic and atraumatic Eyes:  PERRLA/EOM intact; conjunctiva and lids normal. Mouth:  Teeth, gums and palate normal. Oral mucosa normal. Neck:  Neck supple, no JVD. No masses, thyromegaly or abnormal cervical nodes. Lungs:  Clear bilaterally to auscultation and percussion. Heart:  Non-displaced PMI, chest non-tender; regular rate and rhythm, S1, S2 without murmurs, rubs or gallops. Carotid upstroke normal, no bruit. Normal abdominal aortic size, no bruits. Femorals normal pulses,  no bruits. Pedals normal pulses. No edema, no varicosities. Abdomen:  Bowel sounds positive; abdomen soft and non-tender without masses, organomegaly, or hernias noted. No hepatosplenomegaly. Msk:  Back normal, normal gait. Muscle strength and tone normal. Pulses:  pulses normal in all 4 extremities Extremities:  No clubbing or cyanosis. Neurologic:  Alert and oriented x 3. Skin:  Intact without lesions or  rashes. Psych:  flat affect   Impression & Recommendations:  Problem # 1:  ATRIAL TACHYCARDIA (ICD-427.89) SVT is improved/ stable with as needed cardizem most of her symptoms of palpitations on a recent event monitor obtained by Dr Sherril Croon were sinus rhythm. no changes today  Problem # 2:  CAD, NATIVE VESSEL (ICD-414.01) stable no changes   Problem # 3:  BRADYCARDIA (ICD-427.89) asymptomatic  Problem # 4:  CHEST PAIN, ATYPICAL (ICD-786.59) improved  Patient Instructions: 1)  Your physician recommends that you schedule a follow-up appointment in: 6 months . 2)  Your physician recommends that you continue on your current medications as directed. Please refer to the Current Medication list given to you today.

## 2010-10-30 ENCOUNTER — Other Ambulatory Visit (INDEPENDENT_AMBULATORY_CARE_PROVIDER_SITE_OTHER): Payer: Self-pay | Admitting: Internal Medicine

## 2010-10-30 ENCOUNTER — Ambulatory Visit (INDEPENDENT_AMBULATORY_CARE_PROVIDER_SITE_OTHER): Payer: Medicaid Other | Admitting: Internal Medicine

## 2010-10-30 DIAGNOSIS — R1310 Dysphagia, unspecified: Secondary | ICD-10-CM

## 2010-10-30 DIAGNOSIS — Z7901 Long term (current) use of anticoagulants: Secondary | ICD-10-CM

## 2010-10-30 DIAGNOSIS — Z7982 Long term (current) use of aspirin: Secondary | ICD-10-CM

## 2010-11-01 ENCOUNTER — Encounter (HOSPITAL_COMMUNITY): Payer: Self-pay

## 2010-11-01 ENCOUNTER — Ambulatory Visit (HOSPITAL_COMMUNITY)
Admission: RE | Admit: 2010-11-01 | Discharge: 2010-11-01 | Disposition: A | Discharge status: Home / Self Care | Payer: Medicaid Other | Source: Ambulatory Visit | Attending: Internal Medicine | Admitting: Internal Medicine

## 2010-11-01 DIAGNOSIS — R1310 Dysphagia, unspecified: Secondary | ICD-10-CM | POA: Insufficient documentation

## 2010-11-01 HISTORY — DX: Unspecified convulsions: R56.9

## 2010-11-20 ENCOUNTER — Encounter: Payer: Self-pay | Admitting: Internal Medicine

## 2010-11-20 ENCOUNTER — Ambulatory Visit (INDEPENDENT_AMBULATORY_CARE_PROVIDER_SITE_OTHER): Payer: Medicaid Other | Admitting: Internal Medicine

## 2010-11-20 DIAGNOSIS — I471 Supraventricular tachycardia, unspecified: Secondary | ICD-10-CM

## 2010-11-20 DIAGNOSIS — I2510 Atherosclerotic heart disease of native coronary artery without angina pectoris: Secondary | ICD-10-CM

## 2010-11-28 NOTE — Consult Note (Signed)
Summary: PULMONARY CONSULT/ DR. Ninetta Lights  PULMONARY CONSULT/ DR. Ninetta Lights   Imported By: Zachary George 11/20/2010 11:23:07  _____________________________________________________________________  External Attachment:    Type:   Image     Comment:   External Document

## 2010-11-28 NOTE — Letter (Signed)
Summary: Discharge [facility]  Discharge [facility]   Imported By: Dorise Hiss 11/20/2010 11:10:19  _____________________________________________________________________  External Attachment:    Type:   Image     Comment:   External Document

## 2010-11-28 NOTE — Consult Note (Signed)
Summary: CARDIOLOGY CONSULT/ [facility]  CARD[facility]OLOGY CONSULT/ [facility]   Imported By: Zachary George 11/20/2010 11:19:16  _____________________________________________________________________  External Attachment:    Type:   Image     Comment:   External Document

## 2010-11-28 NOTE — Assessment & Plan Note (Signed)
Summary: FOLLOW UP/SL/PER PT CALL.MJ/KL   Visit Type:  Follow-up Referring Provider:  Dr Simona Huh Primary Provider:  Dr Doreen Beam   History of Present Illness: The patient presents today for routine electrophysiology followup. She reports doing reasonably well since last being seen in our clinic.  She was however hospitalized 11/11.  At that time, she was admitted with seizures and bronchitis.  She apparhently had a mucous plug and then developed respiratory failure requiring intubation.  During weaning attempts, she was found to have seizures.  I have reviewed the medical record and it does not appear that she had any documented arrhythmias. Since hospital discharge, she has done very well.  Her palpitations and chronic atypical chest pain have improved.  She denies symptoms of shortness of breath, orthopnea, PND, lower extremity edema, presyncope, syncope, or neurologic sequela.  The patient is tolerating medications without difficulties and is otherwise without complaint today.   Current Medications (verified): 1)  Simvastatin 10 Mg Tabs (Simvastatin) .... Take One Tablet By Mouth Daily At Bedtime 2)  Aspirin 81 Mg Tbec (Aspirin) .... Take One Tablet By Mouth Daily 3)  Prilosec 20 Mg Cpdr (Omeprazole) .... Take 1 Capsule By Mouth Once A Day 4)  Humulin 70/30 70-30 % Susp (Insulin Isophane & Reg (Human)) .... Inject 20 Units Subcutaneously Each Am and 10 Units Each Pm 5)  Cardizem 30 Mg Tabs (Diltiazem Hcl) .... As Needed Palpitations 6)  Nitrostat 0.4 Mg Subl (Nitroglycerin) .... Place 1 Tablet Under Tongue As Directed 7)  Plavix 75 Mg Tabs (Clopidogrel Bisulfate) .... Take One Tablet By Mouth Daily 8)  Celexa 20 Mg Tabs (Citalopram Hydrobromide) .... Take One Tablet By Mouth Once Daily. 9)  Atrovent Hfa 17 Mcg/act Aers (Ipratropium Bromide Hfa) .... Uad 10)  Depakote Er 500 Mg Xr24h-Tab (Divalproex Sodium) .... Two Times A Day 11)  Ranitidine Hcl 300 Mg Caps (Ranitidine Hcl) ....  At Bedtime  Allergies: 1)  ! Ampicillin  Past History:  Past Medical History: CAD - DES mid RCA 5/08 and DES mid RCA 5/10, LVEF normal Chronic pain syndrome Mononeuritis multiplex Atrial tachycardia and history of bradycardia, on long-acting diltiazem G E R D Bilateral nephrolithiasis Bilateral pulmonary nodules Pneumonia Diabetes Type 2 Hyperlipidemia Anxiety Seizure disorder  Past Surgical History: Reviewed history from 02/23/2010 and no changes required. Cholecystectomy Total hysterectomy Right knee surgery  Social History: Reviewed history from 02/23/2010 and no changes required. Lives in [place]  She has two children Disabled  Married  Tobacco Use - No Alcohol Use - no  Review of Systems       All systems are reviewed and negative except as listed in the HPI.   Vital Signs:  Patient profile:   50 year old female Height:      67 inches Weight:      132 pounds Pulse rate:   55 / minute BP sitting:   110 / 68  (left arm)  Vitals Entered By: Laurance Flatten CMA (November 20, 2010 10:15 AM)  Physical Exam  General:  NAD, affect is more full than usual Head:  normocephalic and atraumatic Eyes:  PERRLA/EOM intact; conjunctiva and lids normal. Mouth:  Teeth, gums and palate normal. Oral mucosa normal. Neck:  Neck supple, no JVD. No masses, thyromegaly or abnormal cervical nodes. Lungs:  Clear bilaterally to auscultation and percussion. Heart:  Non-displaced PMI, chest non-tender; regular rate and rhythm, S1, S2 without murmurs, rubs or gallops. Carotid upstroke normal, no bruit. Normal abdominal aortic  size, no bruits. Femorals normal pulses, no bruits. Pedals normal pulses. No edema, no varicosities. Abdomen:  Bowel sounds positive; abdomen soft and non-tender without masses, organomegaly, or hernias noted. No hepatosplenomegaly. Msk:  Back normal, normal gait. Muscle strength and tone normal. Extremities:  No clubbing or cyanosis. Neurologic:  Alert and  oriented x 3. Skin:  Intact without lesions or rashes. Psych:  euthymic mood affect more full than usual today   EKG  Procedure date:  11/22/2010  Findings:      sinus rhythm 55 bpm, otherwise normal ekg  Impression & Recommendations:  Problem # 1:  ATRIAL TACHYCARDIA (ICD-427.89) improved no changes  Problem # 2:  CHEST PAIN, ATYPICAL (ICD-786.59) improved  Problem # 3:  CAD, NATIVE VESSEL (ICD-414.01) stable per Dr Diona Browner  Patient Instructions: 1)  Your physician wants you to follow-up in:  6 months with Dr Jacquiline Doe will receive a reminder letter in the mail two months in advance. If you don't receive a letter, please call our office to schedule the follow-up appointment. 2)  Your physician recommends that you continue on your current medications as directed. Please refer to the Current Medication list given to you today.

## 2010-12-02 LAB — BASIC METABOLIC PANEL
CO2: 27 mEq/L (ref 19–32)
Calcium: 9 mg/dL (ref 8.4–10.5)
GFR calc Af Amer: >60 mL/min (ref >60)
Sodium: 140 mEq/L (ref 135–145)

## 2010-12-02 LAB — CBC
Hemoglobin: 12.9 g/dL (ref 12.0–15.0)
MCH: 29.8 pg (ref 26.0–34.0)
Platelets: 172 10*3/uL (ref 150–400)
RBC: 4.33 MIL/uL (ref 3.87–5.11)
WBC: 6.9 10*3/uL (ref 4.0–10.5)

## 2010-12-02 LAB — GLUCOSE, CAPILLARY: Glucose-Capillary: 227 mg/dL — ABNORMAL HIGH (ref 70–99)

## 2010-12-02 LAB — DIFFERENTIAL
Lymphocytes Relative: 18 % (ref 12–46)
Lymphs Abs: 1.2 10*3/uL (ref 0.7–4.0)
Monocytes Relative: 6 % (ref 3–12)
Neutro Abs: 5.1 10*3/uL (ref 1.7–7.7)
Neutrophils Relative %: 74 % (ref 43–77)

## 2010-12-20 LAB — BASIC METABOLIC PANEL
BUN: 10 mg/dL (ref 6–23)
CO2: 29 mEq/L (ref 19–32)
CO2: 30 mEq/L (ref 19–32)
Calcium: 9.5 mg/dL (ref 8.4–10.5)
Chloride: 100 mEq/L (ref 96–112)
Creatinine, Ser: 0.78 mg/dL (ref 0.4–1.2)
Glucose, Bld: 164 mg/dL — ABNORMAL HIGH (ref 70–99)
Glucose, Bld: 284 mg/dL — ABNORMAL HIGH (ref 70–99)
Potassium: 4.1 mEq/L (ref 3.5–5.1)
Sodium: 139 mEq/L (ref 135–145)

## 2010-12-20 LAB — LIPID PANEL
LDL Cholesterol: 55 mg/dL (ref 0–99)
VLDL: 10 mg/dL (ref 0–40)

## 2010-12-20 LAB — CBC
HCT: 37.4 % (ref 36.0–46.0)
Hemoglobin: 13.2 g/dL (ref 12.0–15.0)
MCHC: 33.8 g/dL (ref 30.0–36.0)
MCHC: 34.4 g/dL (ref 30.0–36.0)
MCV: 86.7 fL (ref 78.0–100.0)
Platelets: 159 10*3/uL (ref 150–400)
RDW: 13.1 % (ref 11.5–15.5)
RDW: 13.4 % (ref 11.5–15.5)

## 2010-12-20 LAB — URINALYSIS, ROUTINE W REFLEX MICROSCOPIC
Bilirubin Urine: NEGATIVE
Glucose, UA: >1000 mg/dL — AB
Hgb urine dipstick: NEGATIVE
Protein, ur: NEGATIVE mg/dL
Urobilinogen, UA: 0.2 mg/dL (ref 0.0–1.0)

## 2010-12-20 LAB — GLUCOSE, CAPILLARY
Glucose-Capillary: 157 mg/dL — ABNORMAL HIGH (ref 70–99)
Glucose-Capillary: 185 mg/dL — ABNORMAL HIGH (ref 70–99)
Glucose-Capillary: 191 mg/dL — ABNORMAL HIGH (ref 70–99)
Glucose-Capillary: 56 mg/dL — ABNORMAL LOW (ref 70–99)
Glucose-Capillary: 76 mg/dL (ref 70–99)

## 2010-12-20 LAB — PROTIME-INR: INR: 1.06 (ref 0.00–1.49)

## 2010-12-20 LAB — D-DIMER, QUANTITATIVE: D-Dimer, Quant: <0.22 ug/mL-FEU (ref 0.00–0.48)

## 2010-12-20 LAB — APTT: aPTT: 28 seconds (ref 24–37)

## 2010-12-20 LAB — URINE MICROSCOPIC-ADD ON

## 2010-12-24 LAB — GLUCOSE, CAPILLARY
Glucose-Capillary: 102 mg/dL — ABNORMAL HIGH (ref 70–99)
Glucose-Capillary: 103 mg/dL — ABNORMAL HIGH (ref 70–99)
Glucose-Capillary: 110 mg/dL — ABNORMAL HIGH (ref 70–99)
Glucose-Capillary: 117 mg/dL — ABNORMAL HIGH (ref 70–99)
Glucose-Capillary: 149 mg/dL — ABNORMAL HIGH (ref 70–99)
Glucose-Capillary: 174 mg/dL — ABNORMAL HIGH (ref 70–99)
Glucose-Capillary: 192 mg/dL — ABNORMAL HIGH (ref 70–99)
Glucose-Capillary: 237 mg/dL — ABNORMAL HIGH (ref 70–99)
Glucose-Capillary: 51 mg/dL — ABNORMAL LOW (ref 70–99)
Glucose-Capillary: 93 mg/dL (ref 70–99)

## 2010-12-24 LAB — POCT I-STAT, CHEM 8
BUN: 16 mg/dL (ref 6–23)
Calcium, Ion: 1.22 mmol/L (ref 1.12–1.32)
Chloride: 104 mEq/L (ref 96–112)
Creatinine, Ser: 0.8 mg/dL (ref 0.4–1.2)
Glucose, Bld: 208 mg/dL — ABNORMAL HIGH (ref 70–99)
HCT: 39 % (ref 36.0–46.0)
Hemoglobin: 13.3 g/dL (ref 12.0–15.0)
Potassium: 4 mEq/L (ref 3.5–5.1)
Sodium: 141 mEq/L (ref 135–145)
TCO2: 26 mmol/L (ref 0–100)

## 2010-12-24 LAB — HEPARIN LEVEL (UNFRACTIONATED): Heparin Unfractionated: 1.08 IU/mL — ABNORMAL HIGH (ref 0.30–0.70)

## 2010-12-24 LAB — POCT CARDIAC MARKERS
CKMB, poc: <1 ng/mL — ABNORMAL LOW (ref 1.0–8.0)
CKMB, poc: <1 ng/mL — ABNORMAL LOW (ref 1.0–8.0)
Myoglobin, poc: 34.5 ng/mL (ref 12–200)
Myoglobin, poc: 49.5 ng/mL (ref 12–200)
Troponin i, poc: <0.05 ng/mL (ref 0.00–0.09)
Troponin i, poc: <0.05 ng/mL (ref 0.00–0.09)

## 2010-12-24 LAB — DIFFERENTIAL
Basophils Relative: 1 % (ref 0–1)
Eosinophils Absolute: 0.1 10*3/uL (ref 0.0–0.7)
Lymphs Abs: 1.9 10*3/uL (ref 0.7–4.0)
Monocytes Relative: 7 % (ref 3–12)
Neutro Abs: 3.1 10*3/uL (ref 1.7–7.7)
Neutrophils Relative %: 56 % (ref 43–77)

## 2010-12-24 LAB — URINALYSIS, ROUTINE W REFLEX MICROSCOPIC
Glucose, UA: 100 mg/dL — AB
Protein, ur: NEGATIVE mg/dL
pH: 7 (ref 5.0–8.0)

## 2010-12-24 LAB — CBC
MCHC: 34.1 g/dL (ref 30.0–36.0)
Platelets: 205 10*3/uL (ref 150–400)
RBC: 4.35 MIL/uL (ref 3.87–5.11)
WBC: 5.6 10*3/uL (ref 4.0–10.5)

## 2010-12-24 LAB — CARDIAC PANEL(CRET KIN+CKTOT+MB+TROPI)
CK, MB: 0.7 ng/mL (ref 0.3–4.0)
Relative Index: INVALID (ref 0.0–2.5)
Troponin I: 0.01 ng/mL (ref 0.00–0.06)

## 2010-12-24 LAB — URINE MICROSCOPIC-ADD ON

## 2010-12-24 LAB — TROPONIN I: Troponin I: <0.01 ng/mL (ref 0.00–0.06)

## 2010-12-25 LAB — POCT CARDIAC MARKERS
Myoglobin, poc: 29.3 ng/mL (ref 12–200)
Troponin i, poc: <0.05 ng/mL (ref 0.00–0.09)

## 2010-12-25 LAB — CBC
MCHC: 34 g/dL (ref 30.0–36.0)
Platelets: 220 10*3/uL (ref 150–400)
RDW: 13.5 % (ref 11.5–15.5)

## 2010-12-25 LAB — GLUCOSE, CAPILLARY
Glucose-Capillary: 130 mg/dL — ABNORMAL HIGH (ref 70–99)
Glucose-Capillary: 200 mg/dL — ABNORMAL HIGH (ref 70–99)
Glucose-Capillary: 221 mg/dL — ABNORMAL HIGH (ref 70–99)

## 2010-12-25 LAB — DIFFERENTIAL
Basophils Absolute: 0 10*3/uL (ref 0.0–0.1)
Basophils Relative: 0 % (ref 0–1)
Lymphocytes Relative: 32 % (ref 12–46)
Neutro Abs: 3.8 10*3/uL (ref 1.7–7.7)
Neutrophils Relative %: 59 % (ref 43–77)

## 2010-12-25 LAB — MAGNESIUM: Magnesium: 2.1 mg/dL (ref 1.5–2.5)

## 2010-12-25 LAB — CK TOTAL AND CKMB (NOT AT ARMC)
CK, MB: 0.5 ng/mL (ref 0.3–4.0)
CK, MB: 0.6 ng/mL (ref 0.3–4.0)
CK, MB: 0.7 ng/mL (ref 0.3–4.0)
Relative Index: INVALID (ref 0.0–2.5)
Total CK: 50 U/L (ref 7–177)
Total CK: 59 U/L (ref 7–177)

## 2010-12-25 LAB — TROPONIN I
Troponin I: 0.01 ng/mL (ref 0.00–0.06)
Troponin I: <0.01 ng/mL (ref 0.00–0.06)

## 2010-12-25 LAB — D-DIMER, QUANTITATIVE: D-Dimer, Quant: 0.64 ug/mL-FEU — ABNORMAL HIGH (ref 0.00–0.48)

## 2010-12-25 LAB — POCT I-STAT, CHEM 8
BUN: 12 mg/dL (ref 6–23)
Chloride: 106 mEq/L (ref 96–112)
HCT: 44 % (ref 36.0–46.0)
Sodium: 143 mEq/L (ref 135–145)

## 2010-12-25 LAB — HEMOGLOBIN A1C: Hgb A1c MFr Bld: 7.1 % — ABNORMAL HIGH (ref 4.6–6.1)

## 2010-12-26 LAB — POCT CARDIAC MARKERS
CKMB, poc: <1 ng/mL — ABNORMAL LOW (ref 1.0–8.0)
CKMB, poc: <1 ng/mL — ABNORMAL LOW (ref 1.0–8.0)
CKMB, poc: <1 ng/mL — ABNORMAL LOW (ref 1.0–8.0)
Myoglobin, poc: 23.7 ng/mL (ref 12–200)
Troponin i, poc: <0.05 ng/mL (ref 0.00–0.09)

## 2010-12-26 LAB — BASIC METABOLIC PANEL
CO2: 29 mEq/L (ref 19–32)
Calcium: 9.5 mg/dL (ref 8.4–10.5)
Creatinine, Ser: 0.72 mg/dL (ref 0.4–1.2)
Glucose, Bld: 149 mg/dL — ABNORMAL HIGH (ref 70–99)

## 2010-12-26 LAB — CARDIAC PANEL(CRET KIN+CKTOT+MB+TROPI)
CK, MB: 0.7 ng/mL (ref 0.3–4.0)
Troponin I: 0.01 ng/mL (ref 0.00–0.06)

## 2010-12-26 LAB — TROPONIN I: Troponin I: 0.02 ng/mL (ref 0.00–0.06)

## 2010-12-26 LAB — LIPID PANEL
Cholesterol: 136 mg/dL (ref 0–200)
HDL: 79 mg/dL (ref >39)
LDL Cholesterol: 54 mg/dL (ref 0–99)
Total CHOL/HDL Ratio: 1.7 RATIO
Triglycerides: 16 mg/dL (ref <150)
VLDL: 3 mg/dL (ref 0–40)

## 2010-12-26 LAB — GLUCOSE, CAPILLARY
Glucose-Capillary: 135 mg/dL — ABNORMAL HIGH (ref 70–99)
Glucose-Capillary: 144 mg/dL — ABNORMAL HIGH (ref 70–99)
Glucose-Capillary: 159 mg/dL — ABNORMAL HIGH (ref 70–99)
Glucose-Capillary: 167 mg/dL — ABNORMAL HIGH (ref 70–99)

## 2010-12-26 LAB — POCT I-STAT, CHEM 8
BUN: 17 mg/dL (ref 6–23)
Chloride: 105 mEq/L (ref 96–112)
Glucose, Bld: 228 mg/dL — ABNORMAL HIGH (ref 70–99)
HCT: 46 % (ref 36.0–46.0)
Potassium: 3.9 mEq/L (ref 3.5–5.1)

## 2010-12-26 LAB — CBC
HCT: 38.1 % (ref 36.0–46.0)
Hemoglobin: 13 g/dL (ref 12.0–15.0)
Hemoglobin: 14.2 g/dL (ref 12.0–15.0)
MCHC: 34.2 g/dL (ref 30.0–36.0)
MCHC: 35.2 g/dL (ref 30.0–36.0)
MCV: 86.8 fL (ref 78.0–100.0)
RBC: 4.39 MIL/uL (ref 3.87–5.11)
RDW: 13.2 % (ref 11.5–15.5)
RDW: 13.4 % (ref 11.5–15.5)

## 2010-12-26 LAB — MAGNESIUM: Magnesium: 2.3 mg/dL (ref 1.5–2.5)

## 2010-12-26 LAB — CK TOTAL AND CKMB (NOT AT ARMC): Relative Index: INVALID (ref 0.0–2.5)

## 2010-12-26 LAB — APTT: aPTT: 27 seconds (ref 24–37)

## 2011-01-01 LAB — GLUCOSE, CAPILLARY: Glucose-Capillary: 132 mg/dL — ABNORMAL HIGH (ref 70–99)

## 2011-01-30 NOTE — H&P (Signed)
Haley [name], VANDENBOOM NO.:  [id number]   MEDICAL RECORD NO.:  [id number]          PATIENT TYPE:  INP   LOCATION:  3302                         FACILITY:  [facility]   PHYSICIAN:  Wendi Snipes, MD DATE OF BIRTH:  Aug 22, 1961   DATE OF ADMISSION:  04/10/2009  DATE OF DISCHARGE:                              HISTORY & PHYSICAL   TIME:  3:30 a.m.   CARDIOLOGIST:  Dr. Diona Browner.   ELECTROPHYSIOLOGIST:  Dr. Johney Frame.   CHIEF COMPLAINTS:  Chest pain.   HISTORY OF PRESENT ILLNESS:  This is a 50 year old white female with a  history of coronary artery disease status post PCI times 2 to the RCA  here with chest pain.  She states that the pain occurred suddenly this  evening while she got up to wash dishes.  She describes the pain as  pressure, it radiates to her left upper extremity and jaw.  Her family  states that she was recently very upset about her mother's death after  speaking with her brother about this.  Her pain occurred shortly after  this and she has done this frequently in the last few weeks to months.  Otherwise, she has been active and denies any exertional chest pain,  shortness of breath lower extremity edema, paroxysmal nocturnal dyspnea  or orthopnea.   PAST MEDICAL HISTORY:  1. Coronary disease status post drug-eluting stent to the right      coronary artery in 2008, status post PCI to in-stent restenosis of      that stent in May 2010.  2. Insulin-dependent diabetes.  3. Hyperlipidemia.  4. GERD.  5. Chronic pain syndrome.   ALLERGIES:  AMPICILLIN.   MEDICATIONS ON ADMISSION:  1. Diltiazem 240 mg daily.  2. Aspirin 325 mg daily.  3. Plavix 75 mg daily.  4. Zocor 40 mg every night.  5. Prilosec 20 mg daily.  6. Neurontin 100 mg at night.  7. Insulin 70/30, 20 units in the morning and 10 units at night.  8. Phenergan as needed.  9. Klonopin as needed.  10.Nitroglycerin.   SOCIAL HISTORY:  She lives in [place] with husband.  She denies  ever  smoking cigarettes.   FAMILY HISTORY:  Her mother died at 32 of a myocardial infarction and  her father died at age 73 of COPD.   REVIEW OF SYSTEMS:  All 14 systems were reviewed and were negative  except those mentioned in detail in the HPI.   PHYSICAL EXAM:  Her blood pressure was 111/53, her respiratory rate 16,  her pulse was 51.  She is saturating 97% on 2 liters nasal cannula.  GENERAL:  She is a 50 year old white female appearing stated age, in  mild acute distress.  HEENT:  Moist mucous membranes.  Pupils equal, round and react to light  and accommodation.  Anicteric sclerae.  NECK:  No jugular venous distention.  No thyromegaly.  CARDIOVASCULAR:  Bradycardic.  No murmurs, rubs or gallops.  LUNGS:  Clear to auscultation bilaterally.  ABDOMEN:  Nontender, nondistended.  Positive bowel sounds.  No masses.  EXTREMITIES:  No clubbing,  cyanosis, edema.  NEUROLOGIC:  Alert and oriented x3.  Cranial nerves II-XII grossly  intact.  No focal neurologic deficits.  SKIN:  Warm, dry and intact.  No rashes.  PSYCH:  Depressed mood, flat affect.   RADIOLOGY REVIEW:  Chest x-ray showed no acute cardiopulmonary process.  Electrocardiogram showed sinus bradycardia at a rate of 45 beats per  minute with no ST or T-wave abnormalities.   LABS:  The white blood cell count is 5.6, hematocrit is 39 and platelet  count is 205.  Her potassium is 4.0, sodium is 141.  Her creatinine is  0.8.  Her BUN is 16.  Her glucose is 208.  Her troponin I is less than  0.05.  Her CK-MB is less than 1.   ASSESSMENT/PLAN:  Assessment:  This is a 50 year old white female with a  history of coronary disease here with chest pain.  1. Unstable angina.  There is currently no objective evidence of      ischemia and this is likely related to her emotional stress.      However, she does have a concerning story and a history of coronary      disease.  Currently, we will treat her like an unstable angina and       anticoagulate her with heparin and continue her dual antiplatelet      therapy.  Will rule out myocardial infarction with a set of cardiac      biomarkers and consider some further evaluation of her recent      percutaneous coronary intervention to her right coronary artery as      her circumflex and left anterior descending were reported as having      been free of disease.  2. Anxiety and depression.  She needs close outpatient follow-up and      will continue to speak with her about this issue.      Wendi Snipes, MD  Electronically Signed     BHH/MEDQ  D:  04/10/2009  T:  04/10/2009  Job:  [id number]

## 2011-01-30 NOTE — Assessment & Plan Note (Signed)
[facility]                          [facility] OFFICE NOTE   Haley [name], Haley [name]                      MRN:          [id number]  DATE:01/18/2009                            DOB:          02-23-1961    PRIMARY CARE PHYSICIAN:  Doreen Beam, MD   ELECTROPHYSIOLOGIST:  Hillis Range, MD   REASON FOR VISIT:  Routine followup.   HISTORY OF PRESENT ILLNESS:  Haley [name] was last seen in the office back  in December.  I referred her for further electrophysiology evaluation  with Dr. Johney Frame given recurrent paroxysmal supraventricular tachycardia.  She underwent electrophysiology study in January with findings of atrial  tachycardia, but no clear rhythms that could mapped or ablated and  specifically no inducible arrhythmias or dual atrioventricular nodal  physiology.  She was initially treated with nadolol and has seen Dr.  Johney Frame back in the office with medication changes to include long-acting  diltiazem with p.r.n. short-acting diltiazem for breakthrough  palpitations.  This combination seems to be reasonably effective,  although she does have breakthrough palpitations at times.  She states  she had a brief episode last night.  She has however had no progressive  dizziness and certainly no syncope.  She does have occasional chest  pain, which could be anginal and we did talk about using sublingual  nitroglycerin if she needs to.  She is having no progressive exertional  chest pain however.  Of note, she did have a nonischemic Myoview through  [facility] in October 2009.  Her ejection fraction by  echocardiography in December was normal at 60-65% with no major valvular  abnormalities.   ALLERGIES:  AMPICILLIN.   PRESENT MEDICATIONS:  1. Prilosec 20 mg p.o. daily.  2. Aspirin 81 mg p.o. daily.  3. Humulin 70/30, 90 units subcu q.a.m. and 10 units subcu q.p.m.  4. Klonopin 0.5 mg p.o. b.i.d. p.r.n.  5. Simvastatin 40 mg p.o. at  bedtime.  6. Diltiazem CD 240 mg p.o. daily.  7. Gabapentin 100 mg p.o. at bedtime.  8. Sublingual nitroglycerin 0.4 mg p.r.n.  9. Diltiazem 30 mg p.o. q.6 h. p.r.n.  10.Phenergan 25 mg p.o. q.i.d. p.r.n.   REVIEW OF SYSTEMS:  As outlined above.  Otherwise reviewed and negative.   PHYSICAL EXAMINATION:  VITAL SIGNS:  Blood pressure is 118/70, heart  rate is 60, weight is 120 pounds.  NECK:  Reveals no elevated jugular venous pressure.  No loud bruits.  No  thyromegaly is noted.  LUNGS:  Clear with labored breathing at rest.  CARDIAC:  Regular rate and rhythm.  No murmur or gallop.  EXTREMITIES:  No significant pitting edema.   IMPRESSION AND RECOMMENDATIONS:  1. Coronary artery disease status post drug-eluting stent placement to      the mid right coronary artery in May 2008, noted to be widely      patent on angiography and associated with otherwise nonobstructive      left system disease by repeat angiography in July 2008.  Myoview      through [facility] was without ischemia in October  2009.  She is having occasional chest pain, which could be angina      and we did talk about using p.r.n. nitroglycerin.  She does not      feel that these symptoms have progressed; however, and we will      continue medical therapy with observation at this point.  Ejection      fraction is 60-65% by followup echocardiography within the last 6      months.  2. Paroxysmal supraventricular tachycardia/atrial tachycardia.  This      is being followed by Dr. Johney Frame and the patient seems to be doing      reasonably well with symptom control on calcium channel blocker      therapy.  Further electrophysiology testing or antiarrhythmic      therapy could be considered if she manifests progressive symptoms      on good medical therapy.  3. Hyperlipidemia, on simvastatin.  This has been followed by Dr.      Sherril Croon.  Goal LDL should be around 70.     Jonelle Sidle, MD   Electronically Signed    SGM/MedQ  DD: 01/18/2009  DT: 01/18/2009  Job #: [id number]   cc:   Hillis Range, MD  Doreen Beam, MD

## 2011-01-30 NOTE — Discharge Summary (Signed)
Haley [name], Haley [name]               ACCOUNT NO.:  [id number]   MEDICAL RECORD NO.:  [id number]          PATIENT TYPE:  OIB   LOCATION:  2856                         FACILITY:  [facility]   PHYSICIAN:  Hillis Range, MD       DATE OF BIRTH:  1961/03/09   DATE OF ADMISSION:  10/08/2008  DATE OF DISCHARGE:  10/08/2008                               DISCHARGE SUMMARY   DISCHARGE DIAGNOSIS:  Atrial tachycardia.   SECONDARY DIAGNOSES:  1. Diabetes mellitus.  2. Coronary artery disease.  3. Hyperlipidemia.  4. Gastroesophageal reflux disease.   PROCEDURES:  EP study.   CONSULTATIONS:  None.   HISTORY OF PRESENT ILLNESS:  Ms. Haley [name] is a pleasant 50 year old female  with a history of diabetes, coronary artery disease, and recently  diagnosed supraventricular tachycardia.  She reports abrupt onset and  termination of episodic heart racing with associated chest discomfort.  A event monitor placed in September 2009 documented episodic atrial  tachycardia with heart rates of 140 beats per minute, which correlated  to her symptoms.  She, therefore, presents for EP study and  radiofrequency ablation of her atrial tachycardia.   HOSPITAL COURSE:  Informed written consent was obtained, and the patient  was brought to the Electrophysiology Lab.  She was noted to be in sinus  rhythm upon presentation.  She had no evidence of dual AV nodal  physiology or accessory pathways.  Exhaustive effort was made to induce  the patient's atrial tachycardia.  Isoproterenol was infused at 0.25,  0.5, 1, and 2 mcg per minute and allowed to wash out between doses.  Atrial and ventricular pacing was performed with multiple extra stimuli  used.  The patient had brief (3 beats) of nonsustained atrial  tachycardia with rapid atrial pacing at 420 milliseconds.  The  tachycardia cycle length was 400 milliseconds.  The atrial tachycardia  had a surface electrogram T-wave, which was unchanged from sinus rhythm  and was felt  to possibly represent sinus nodal reentry.  As this  tachycardia could not be sustained, it could not be successfully  electroanatomically mapped or ablated.  The procedure was, therefore,  considered completed.  The patient was returned to her room without  early apparent complications.  She remained in sinus rhythm for the  duration of her hospital stay.  At time of discharge, the patient was  alert, ambulatory, hemodynamically stable, and otherwise without  complaint.  Her propranolol was discontinued, and she was initiated on  nadolol 20 mg daily.   DISPOSITION:  Home.   DISCHARGE CONDITION:  Good.   DISCHARGE DIET:  Cardiac diabetic diet.   DISCHARGE INSTRUCTIONS:  Increase activities as tolerated.   DISCHARGE MEDICATIONS:  1. Nadolol 20 mg daily.  2. Simvastatin 40 mg at bedtime.  3. Aspirin 81 mg daily.  4. Prilosec 20 mg daily.  5. Humulin 70/30 units at her previous dose.   FOLLOWUP:  The patient has scheduled followup with me on November 05, 2008.      Hillis Range, MD  Electronically Signed     JA/MEDQ  D:  10/08/2008  T:  10/09/2008  Job:  [id number]   cc:   Jonelle Sidle, MD  Doreen Beam, MD

## 2011-01-30 NOTE — H&P (Signed)
NAMEKIMM, SIDER NO.:  [id number]   MEDICAL RECORD NO.:  [id number]          PATIENT TYPE:  INP   LOCATION:  2926                         FACILITY:  [facility]   PHYSICIAN:  Duke Salvia, MD, FACCDATE OF BIRTH:  25-Jan-1961   DATE OF ADMISSION:  02/10/2009  DATE OF DISCHARGE:                              HISTORY & PHYSICAL   PRIMARY CARDIOLOGIST:  Jonelle Sidle, MD   ELECTROPHYSIOLOGIST:  Hillis Range, MD.   PRIMARY CARE Abrea Henle:  Doreen Beam, MD, in [place].   PATIENT PROFILE:  A 50 year old Caucasian female with prior history of  CAD who presents with chest pain as well as progressive palpitations.   PROBLEM LIST:  1. Coronary artery disease/chest pain.      a.     Jan 20, 2007, PCI and stenting of the mid RCA with placement       of a 2.5 x 16 mm Taxus drug-eluting stent.      b.     April 01, 2007, re-look cardiac catheterization revealing       patent stent with otherwise nonobstructive disease.  EF 60%.  2. Insulin-dependent diabetes mellitus.  3. Hyperlipidemia.  4. Nephrolithiasis.  5. Status post cholecystectomy.  6. Status post hysterectomy.  7. Chronic pain syndrome.  8. GERD.  9. History of atrial tachycardia.      a.     Status post electrophysiologic study showing no inducible or       sustainable arrhythmias.  The patient did have 3 beats of       nonsustained atrial tachycardia.   HISTORY OF PRESENT ILLNESS:  A 50 year old Caucasian female with above  problem list.  Over the past week, she has been having daily 6/10 to  8/10 substernal chest pressure with rest or occurring with rest and  exertion associated with shortness of breath, nausea, lightheadedness,  diaphoresis, and radiating down the left arm.  Symptoms are similar to  previous angina.  Symptoms lasts approximately 50 minutes, resolving  with one sublingual nitroglycerin tablet.  Chest pain symptoms recurred  3-4 times per day.   Also over the past week, she had been  having increasing frequency of  palpitations associated with dyspnea.  Palpitations also occurred 3-4  times per day, but never with chest pain.  She usually takes a p.r.n. 30  mg diltiazem tablet with the first episode and her palpitations ease off  at 45 minutes.  Despite this they will recur 3-4 times additionally.   Due to progression of symptoms.  The patient decided to present to the  ER today evaluation.  Here she was given nitroglycerin and morphine with  improvement of pain.   ALLERGIES:  AMPICILLIN.   HOME MEDICATIONS:  1. Aspirin 81 mg daily.  2. Nitroglycerin p.r.n.  3. 70/30 insulin 20 units in the a.m. and 10 units in the p.m.  4. Prilosec 20 mg daily.  5. Simvastatin 49 mg nightly.  6. Diltiazem 240 mg daily, diltiazem 30 mg p.r.n.   FAMILY HISTORY:  Mother died at age 38 with MI.  Father died with COPD  at 79.  She has a brother who is alive and well.   SOCIAL HISTORY:  She lives at [place] with her husband.  She does not  currently work.  She has never smoked and denies alcohol or drug use.  She walks 1 mile daily with her husband.   REVIEW OF SYSTEMS:  Positive for chest pain, dyspnea, palpitations,  nausea, and lightheadedness.  She is diabetic.  She reports a lot of  stress and anxiety recently.  She is a full code.  Otherwise all systems  reviewed are negative.   PHYSICAL EXAMINATION:  VITAL SIGNS:  Temperature 97.7, heart rate 62,  respirations 16, blood pressure 131/64, pulse ox 90% on 2 liters.  GENERAL:  Pleasant white female, in no acute distress.  Awake, alert,  and oriented x3.  She has a flattened affect.  HEENT:  Normal.  Nares grossly intact, nonfocal.  SKIN:  Warm and dry without lesions or masses.  MUSCULOSKELETAL:  Grossly normal without bony fusion.  NECK:  Supple without bruits or JVD.  LUNGS:  Respirations regular unlabored, clear to auscultation.  CARDIAC:  Regular S1 and S2, no S3 and S4, murmurs.  ABDOMEN:  Round, soft, nontender,  nondistended.  Bowel sounds present  x4.  EXTREMITIES:  Warm, dry, pink.  No clubbing, cyanosis, or edema.  Dorsalis pedis and tibial pulses are 2+ and equal bilaterally.   Chest x-ray shows mild emphysematous changes without acute  abnormalities.  EKG shows sinus bradycardia at rate of 47, normal axis,  no acute ST-T changes.  Sodium 137, potassium 3.9, chloride 105, CO2 of  24, BUN 17, creatinine 0.8, glucose 228, CK-MB less than 1.0, troponin-I  less than 0.05.   ASSESSMENT AND PLAN:  1. Chest pain/coronary artery disease.  The patient presents with      atypical/typical symptoms.  Plan to admit and cycle cardiac      markers.  We will add heparin and nitrate.  She is not on beta-      blocker secondary to baseline bradycardia with requirement of      calcium channel blocker to control her supraventricular      tachycardia.  We keep her n.p.o. after midnight with plan for      catheterization in the a.m.  If her catheterization reveals      nonobstructive disease with patent stent as previously documented      in July 2008.  We would recommend referral to Pain Clinic.  The      patient also appears to have a component of anxiety, depression,      and may benefit from Internal Medicine/Psychiatry evaluation as an      outpatient.  2. Tachy palpitations/atrial tachycardia.  The patient had      electrophysiologic study in January 2010 which showed 3 beats of      nonsustained atrial tachycardia, otherwise no inducible or      sustainable rhythm.  She is followed by Dr. Johney Frame.  Maintained on      long-acting diltiazem daily with p.r.n. diltiazem.  She has uptake      in her frequency of palpitations recently.  The BMET looks okay.      We will check magnesium and then a TSH.  3. Hyperlipidemia.  Check lipids LFTs.  Continue statin.  4. Gastroesophageal reflux disease.  Continue PPI.  5. Anxiety/depression.  The patient does not take anything for this at      home and would likely  benefit  from outpatient evaluation.  She sees      Dr. Sherril Croon in the outpatient setting..  6. Diabetes mellitus.  Continues her home regimen of 70/30 insulin.      Nicolasa Ducking, ANP      Duke Salvia, MD, [facility]  Electronically Signed    CB/MEDQ  D:  02/10/2009  T:  02/11/2009  Job:  [id number]

## 2011-01-30 NOTE — Assessment & Plan Note (Signed)
[facility]                          [facility]ICE NOTE   JAILINE, LIEDER                      MRN:          [id number]  DATE:07/15/2008                            DOB:          1961/03/18    ADDENDUM   PLAN:  The patient was resumed on Simvastatin a previous dose of 20 mg  daily.  She will need to follow up fasting lipids/liver profile in 12  weeks.     Gene Serpe, PA-C     GS/MedQ  DD: 07/15/2008  DT: 07/16/2008  Job #: [id number]

## 2011-01-30 NOTE — Discharge Summary (Signed)
[name], [name] NO.:  [id number]   MEDICAL RECORD NO.:  [id number]          PATIENT TYPE:  INP   LOCATION:  3703                         FACILITY:  [facility]   PHYSICIAN:  Dorian Pod, ACNP  DATE OF BIRTH:  December 26, 1960   DATE OF ADMISSION:  04/01/2007  DATE OF DISCHARGE:  04/02/2007                               DISCHARGE SUMMARY   DISCHARGING PHYSICIAN:  Dr. Maurine Cane.   PRIMARY CARDIOLOGIST:  Dr. Andee Lineman.   PRIMARY CARE DOCTOR:  Dr. Doreen Beam.   DISCHARGING DIAGNOSES:  Chest pain ruled out for myocardial infarction  this admission, status post cardiac catheterization.  On day of  admission, the patient with coronary artery disease status post previous  percutaneous coronary retention with nonobstructive disease, with no  significant obstruction in the LAD artery and circumflex arteries and 0%  stenosis at the stent in the mid-right coronary artery, with normal LV  function.  No obvious source of ischemia at this time.   PAST MEDICAL HISTORY:  Includes:  1. Coronary artery disease, single-vessel status post Taxus stent to      the RCA in May, EF 60%.  2. Insulin-dependent diabetes.  3. Hyperlipidemia.  4. History of bilateral nephrolithiasis.  5. Status post cholecystectomy.  6. Total hysterectomy.  7. Chronic pain syndrome.  8. Questionable GERD.  The patient is being discharged home with a      prescription for Nexium this admission.   HOSPITAL COURSE:  Ms. Pack is a 50 year old female, status post  cardiac catheterization in May, with Taxus drug-eluting stent placed to  the RCA at that time, who presented to [facility] this admission  complaining of chest discomfort.  The patient was transferred to [facility] for further evaluation, underwent cardiac catheterization on day of  admission, results as stated above.  The patient tolerated the procedure  without complications.  The patient ruled out for a myocardial  infarction.  Dr.  Eden Emms in to see the patient on day of discharge.  The  patient being discharged home to follow up with Dr. Andee Lineman or Dr.  Diona Browner up at [facility].  I have scheduled her a post-cath check for July 28  at 9:30.  The patient to follow up with Dr. Sherril Croon, call for appointment.  At time of discharge, potassium 4.6, BUN and creatinine 13, 0.754; H&H  13.9 and 41.3.   VITAL SIGNS:  The patient afebrile, pulse 52, respirations 20, blood  pressure 105/58, 97% on room air.  The patient has been given the post-  cardiac catheterization discharge instructions.   MEDICATIONS AT DISCHARGE:  As previous, include aspirin 325, simvastatin  20, Plavix 75, insulin as previously prescribed, nitroglycerin as  needed, Nexium 40 mg daily, a new prescription.   Duration of discharge encounter less than 30 minutes.      Dorian Pod, ACNP     MB/MEDQ  D:  04/02/2007  T:  04/03/2007  Job:  [id number]   cc:   Doreen Beam

## 2011-01-30 NOTE — Discharge Summary (Signed)
NAMENADYA, Haley [name] NO.:  [id number]   MEDICAL RECORD NO.:  [id number]          PATIENT TYPE:  INP   LOCATION:  3703                         FACILITY:  [facility]   PHYSICIAN:  Hillis Range, MD       DATE OF BIRTH:  [birth year]   DATE OF ADMISSION:  02/19/2009  DATE OF DISCHARGE:  02/21/2009                               DISCHARGE SUMMARY   PRIMARY CAREGIVER:  Doreen Beam, MD, [place], [place].   CARDIOLOGIST:  Jonelle Sidle, MD, [place], [place].   This patient has an allergy to AMPICILLIN, and she had a failure to  control her SVT on PROPRANOLOL in the past.   FINAL DIAGNOSES:  1. Admitted with chest pressure with radiation to left shoulder.  2. History of tachycardia palpitation - nonsustained atrial      tachycardia.      a.     Diltiazem 240 mg daily for suppression.      b.     RESCUE 30 mg p.o. q.6 h. as needed.  3. Enzymes this admission negative x4 (less than 0.01, then 0.01, then      0.01, then less than 0.01).  4. D-dimer 0.64.      a.     Computed tomogram of the chest negative for pulmonary       embolism.      b.     Noncalcified nodules at the base of the right lower lobe,       largest is 4 mm, followup CT in 1 year.   SECONDARY DIAGNOSES:  1. History of coronary artery disease, ejection fraction 60%.      a.     History of TAXUS stent to the mid right coronary artery in       2008.      b.     Last cath was Feb 11, 2009.  The left anterior descending       and the left circumflex were free of significant coronary artery       disease; the right coronary artery stent had an in-stent       restenosis with stent placed to the distal portion of that stent.  2. Electrophysiology study in January 2010, noninducible atrial      tachycardia.  3. Type 1 diabetes.  4. Dyslipidemia.  5. History of nephrolithiasis.  6. Gastroesophageal reflux disease.  7. Status post cholecystectomy/hysterectomy.  8. Chronic pain syndrome.   PROCEDURES THIS ADMISSION:  Only the CT of the chest to rule out  pulmonary embolism with followup in 1 year for right lower lobe  noncalcified nodules.   Of note, the patient had bradycardia with heart rate in the mid 40s this  admission, often per MD order, holding her Cardizem 240 mg.  For  example, on the day of discharge, her a.m. Cardizem was held and her  heart rate was about 52-55.  The patient is not complaining of any  noticeable dyspnea or fatigue.   BRIEF HISTORY:  Ms. Stuller is a 50 year old female.  She has a history  of atrial tachycardia.  This was studied in January 2010.  Nonsustained  atrial tachycardia was noted; however, it could not be induced to become  a sustained rhythm.  She also has coronary artery disease.   While washing dishes on the evening of February 19, 2009, after supper, she  felt her heart racing.  This is similar to her prior SVT spells.  This  spell was particularly severe.  It was associated with intense chest  pressure radiating to left shoulder and left arm as well as she felt  dizzy.  She also describes the feeling as awful.   Her family promptly called 911.  The patient had taken a p.r.n.  diltiazem and a sublingual nitroglycerin.  Her symptoms had resolved by  the time the EMS crew arrived.  In the emergency room, she appears  severely depressed, tearful, and frustrated, but she was without chest  pain or racing at this time.  It should be noted that she did check her  blood sugar at home while having her spell, it was 112 mg/dL.  She has  been using a lot of p.r.n. diltiazem at least once a day, often more  than once a day for frequent tachy palpitations.  The patient was  admitted on February 19, 2009, from the emergency room.  She was ruled out  for acute myocardial infarction by cycling of serial cardiac enzymes.  She will continue on her dual antiplatelet therapy of aspirin and  Plavix.  A stent was recently placed on Feb 11, 2009.  She will  continue  on her diltiazem with p.r.n. diltiazem for rate and rhythm control.  We  will also check a magnesium level and a hemoglobin A1c level, also a D-  dimer.   HOSPITAL COURSE:  The patient was admitted through the emergency room on  February 19, 2009, with an episode which was particularly symptomatic for  atrial tachycardia.  It did resolve when she took p.r.n. dose of  Cardizem and nitroglycerin x2.  The episode left her depressed, tearful,  and completely washed out.  Cardiac enzymes were normal.  She had a D-  dimer which was elevated at 0.64.  This occasioned a computed tomogram  of the chest which showed no evidence of pulmonary embolism.  Once  again, discovery of noncalcified nodules in the base of right lower  lobe.  These will be restudied in 1 year.  Her hemoglobin A1c this  admission was 7.1.  She was fairly bradycardic with heart rate in the  40s, although not particularly symptomatic with that.  She had no  further SVT this admission, although she did have some episodes that  looked as if it could be an inappropriate sinus tachycardia with a  rather slow uptake in heart rate from her baseline into the 110s.  The  patient even with slow heart rate has blood pressure in the 120  systolic.  She had a long talk with Dr. Johney Frame prior to discharge.  He  will ask her to stay on her Cardizem 240 mg daily with her RESCUE dose.   Her other medications include:  1. Enteric-coated aspirin 325 mg daily.  2. Plavix 75 mg daily.  3. Zocor 40 mg daily at bedtime.  4. Diltiazem 240 mg daily with RESCUE tablet of 30 mg for heart racing      as needed.  5. Prilosec 20 mg daily.  6. Neurontin 100 mg daily at bedtime.  7. Insulin 70/30 20 units in morning, 10 units  in the evening.  8. Phenergan as needed.  9. Klonopin as needed.  10.Nitroglycerin 0.4 mg 1 tablet under the tongue every 5 minutes x3      doses for chest pain.   She follows up with [facility], [address] to see  Dr. Johney Frame on Monday, April 25, 2009, at 9:30 in [place].  We will  make the appointment for her to have a CT of the chest under the [facility],  Dr. Diona Browner in 1 year.   Complete blood count this admission on February 20, 2009, hemoglobin 14.1,  hematocrit 41.5, white cells 6.4, and platelets are 220.  Troponin I  studies are less than 0.01, then 0.01, then 0.01, then less than 0.01.  Magnesium 2.1 this admission.  Once again, HGB A1c is 71 and the D-dimer  was 0.64.      Maple Mirza, [place]      Hillis Range, MD  Electronically Signed    GM/MEDQ  D:  02/21/2009  T:  02/22/2009  Job:  [id number]

## 2011-01-30 NOTE — Assessment & Plan Note (Signed)
[facility]                          [facility] OFFICE NOTE   ONEDA, DUFFETT                      MRN:          [id number]  DATE:07/15/2008                            DOB:          07-23-1961    PRIMARY CARDIOLOGIST:  Jonelle Sidle, MD   REASON FOR VISIT:  Annual followup.   Ms. Grandpre returns to our clinic since her last visit here in July 2008.  This was on the heels of her last catheterization which revealed  nonobstructive CAD and widely patent stent site in the mid RCA.  Left  ventricular function was normal.   More recently, she was briefly hospitalized here at [facility]  for overnight observation, following presentation with chest pain.  Serial cardiac markers were all within normal limits.  Dr. Sherril Croon arranged  for an outpatient stress test, performed in his office, which yielded  normal perfusion and normal myocardial infarction.  Of note, calculated  EF was 40%.   She was also placed on a CardioNet monitor for 3 weeks, for further  evaluation of palpitations.  This is now being reviewed and does suggest  transient atrial tachycardia with no definite evidence of atrial  fibrillation.  Interestingly, the patient did report some associated  chest pain with some of these episodes.   Clinically, Ms. Deangelo reports intermittent chest pain since her recent  hospitalization.  However, there is no strict correlation with activity  or exertion.  In fact, she is oftentimes wakened by a sharp pain which  lasts approximately 5 minutes.  She does have significant reflux  disease.  With respect to exertion, she does complain of some shortness  of breath when walking up a hill, but with no associated chest pain.   CURRENT MEDICATIONS:  1. Aspirin 81 daily.  2. Humulin 70/30, 19 units q.a.m./10 units q.p.m.  3. Prilosec 20 mg daily.  4. Klonopin 0.5 b.i.d.   PHYSICAL EXAMINATION:  VITAL SIGNS:  Blood pressure 104/64, pulse  64,  regular.  Weight 123.  GENERAL:  A 50 year old female, sitting upright, in no distress.  HEENT:  Normocephalic, atraumatic.  NECK:  Palpable carotid pulse without bruits; no JVD.  LUNGS:  Clear to auscultation in all fields.  HEART:  Regular rate and rhythm.  No significant murmurs.  No rubs.  ABDOMEN:  Soft, nontender with intact bowel sounds.  EXTREMITIES:  Palpable pulses without edema.  NEUROLOGIC:  No focal deficit.   IMPRESSION:  1. Atypical chest pain.      a.     Recent normal Lexiscan stress test.  2. Coronary artery disease.      a.     Nonobstructive CAD with widely patent RCA stent site by       cardiac catheterization, July 2008.      b.     Status post Taxus stenting mid RCA, May 2008.      c.     Normal LVF; normal wall motion.  3. Tachy palpitations.      a.     Transient atrial tachycardia by recent CardioNet monitoring.  4. Insulin-dependent diabetes  mellitus.  5. Dyslipidemia.      a.     History of elevated HDL.  6. Gastroesophageal reflux disease.  7. Hypotension.   IMPRESSION AND PLAN:  1. Start Inderal LA 60 mg daily for treatment of tachy palpitations      and documented atrial tachycardia.  We also chose this particular      beta-blocker, rather than metoprolol, given some intrinsic anti-      anxiolytic effect, as well.  We suspect that the patient does have      a significant level of anxiety associated with these symptoms.  2. Check TSH level.  3. The patient is advised to stop drinking caffeinated beverages, to      minimize the potential for palpitations.  4. P.r.n. nitroglycerin, given her history of coronary artery disease.  5. Schedule return clinic followup with myself and Dr. Nona Dell      in 2 months, for continued close monitoring.  6. The patient was resumed on Simvastatin at the previous dose of 20      mg daily.  She will need follow up fasting lipid/liver profile in      12 weeks.      Gene Serpe, PA-C  Electronically  Signed      Learta Codding, MD,FACC  Electronically Signed   GS/MedQ  DD: 07/15/2008  DT: 07/16/2008  Job #: [id number]   cc:   Doreen Beam, MD

## 2011-01-30 NOTE — Cardiovascular Report (Signed)
NAMEQUENESHA, DOUGLASS               ACCOUNT NO.:  [id number]   MEDICAL RECORD NO.:  [id number]          PATIENT TYPE:  INP   LOCATION:  3703                         FACILITY:  [facility]   PHYSICIAN:  Everardo Beals. Juanda Chance, MD, FACCDATE OF BIRTH:  03-Oct-1960   DATE OF PROCEDURE:  04/01/2007  DATE OF DISCHARGE:                            CARDIAC CATHETERIZATION   CLINICAL HISTORY:  Ms. Blayney is a 50 year old and in May had a drug-  eluting stent placed to the mid-right coronary artery after being  admitted with unstable angina.  She was seen at [facility]  recently by Gene Serpe and Lewayne Bunting in consultation with recurrent  chest pain and she was referred for repeat catheterization.   PROCEDURE:  The procedure was performed via right femoral artery and  arterial sheath in 5-French preformed coronary catheters.  A front wall  arterial puncture was performed and Omnipaque contrast was used.  The  patient tolerated procedure well and left the laboratory in satisfactory  condition.   RESULTS:  Left main coronary artery:  Left main coronary artery was free  of significant disease.   The left anterior descending artery:  Left anterior descending artery  gave rise to two diagonal branches and three septal perforators.  These  and LV proper were free of significant disease.   The circumflex artery:  Circumflex artery gave rise to a small marginal  branch, a larger marginal branch and two posterolateral branches.  These  vessels were free of significant disease.   The right coronary artery:  Right coronary artery was a moderate-sized  vessel gave rise to a conus branch, a right ventricular branch, a  posterior branch and two small posterolateral branches.  There was 0%  stenosis at the stent site in the mid-right coronary.   THE LEFT VENTRICULOGRAM:  The left ventriculogram was performed in the  RAO projection and showed good wall motion with no areas of hypokinesis.  The estimated  ejection fraction was 60%.   The aortic pressure was 116/57 with a mean of 79.  The left ventricular  pressure was 116/11.   CONCLUSION:  Coronary artery disease status post previous percutaneous  coronary intervention as described above with nonobstructive disease  with no significant obstruction in the left anterior descending artery  and circumflex arteries and 0% stenosis at the stent site in the mid-  right coronary and normal LV function.   RECOMMENDATIONS:  There does not appear to be any source of ischemia.  Will plan reassurance.  Will plan discharge tomorrow.      Bruce Elvera Lennox Juanda Chance, MD, [facility]  Electronically Signed     BRB/MEDQ  D:  04/01/2007  T:  04/02/2007  Job:  [id number]   cc:   Jonelle Sidle, MD  [facility]

## 2011-01-30 NOTE — Op Note (Signed)
NAMEALLYNA, PITTSLEY NO.:  [id number]   MEDICAL RECORD NO.:  [id number]          PATIENT TYPE:  OIB   LOCATION:  2856                         FACILITY:  [facility]   PHYSICIAN:  Hillis Range, MD       DATE OF BIRTH:  1961-05-29   DATE OF PROCEDURE:  10/08/2008  DATE OF DISCHARGE:                               OPERATIVE REPORT   SURGEON:  Hillis Range, MD   PREPROCEDURE DIAGNOSIS:  Atrial tachycardia.   POSTPROCEDURE DIAGNOSES:  Atrial tachycardia.   PROCEDURES:  1. Comprehensive electrophysiologic study.  2. Coronary sinus pacing and recording.  3. Isoproterenol infusion.  4. Arrhythmia induction.   INTRODUCTION:  Ms. Burtt is a pleasant 50 year old female with a  history of diabetes, coronary artery disease, and recently discovered  supraventricular tachycardia.  She has had intermittent episodes of  heart racing with associated chest pain.  A event monitor was placed in  September 2009, which revealed episodic atrial tachycardia with heart  rates of approximately 140 beats per minute.  She has failed medical  therapy with propranolol and therefore presents today for EP study and  radiofrequency ablation.   DESCRIPTION OF PROCEDURE:  Informed written consent was obtained and the  patient was brought to the electrophysiology lab in the fasting state.  She was adequately sedated with intravenous Versed and fentanyl as  outlined in the nursing report.  The patient's right neck and groin were  prepped and draped in the usual sterile fashion by the EP lab staff.  Using a percutaneous Seldinger technique, one 6-French hemostasis sheath  was placed into the right internal jugular vein.  A 6-French curved  Josephson catheter was introduced through the right internal jugular  vein and advanced into the coronary sinus for recording and pacing from  this location.  Using a percutaneous Seldinger technique, one 6-French  and one 8-French hemostasis sheaths were placed  in the right common  femoral vein.  Two 6-French quadripolar Josephson catheters were  introduced through the right common femoral vein and advanced into the  His bundle and right ventricular apex positions respectively.  The  patient continued to be at the electrophysiology lab in normal sinus  rhythm with incomplete right bundle branch block.  Her AH interval  measured 70 milliseconds with an HV interval of 42 milliseconds.  Her  QRS duration was 120 milliseconds.  The RR interval was 1219  milliseconds with a PR interval of 122 milliseconds and a QT interval of  463 milliseconds.  Ventricular pacing was performed which revealed  midline concentric decremental VA conduction with a VA Wenckebach cycle  length of 318 milliseconds with no tachycardias induced.  Ventricular  extrastimulus testing was performed which revealed midline concentric  decremental VA conduction with no AH jump or echo beats.  No tachycardia  was induced.  The ventricular ERP was 500/240 milliseconds.  Rapid  atrial pacing was then performed which revealed no evidence of PR  greater than RR.  The AV Wenckebach cycle length was 360 milliseconds.  Atrial extrastimulus testing was then performed which revealed  decremental AV conduction with  no AH jumps or echo beats.  No  tachycardias were induced.  The atrial ERP was 500/270 milliseconds.  Rapid atrial pacing was then performed down to a cycle length of 250  milliseconds with no tachycardias induced.  Rapid atrial pacing was then  performed down to 200 milliseconds with nonsustained atrial fibrillation  induced.  Additional rapid atrial pacing was performed with no inducible  arrhythmias.  Isoproterenol was therefore infused at 2 mcg/minute with  an adequate heart rate response achieved.  Rapid atrial pacing was  performed after isoproterenol infusion down to a cycle length of 230  milliseconds with no tachycardias induced.  During isoproterenol  infusion, rapid  ventricular pacing was performed down to a cycle length  of 280 milliseconds with a 1:1 VA conduction throughout.  No  tachycardias were induced.  Ventricular extrastimulus testing was  performed down to a cycle length of 500/200 milliseconds with  decremental VA conduction and no AH jump or echo beats observed.  No  tachycardias were induced.  Atrial extrastimulus testing was performed  down to a cycle length of 450/260 milliseconds which was the AV nodal  ERP with no tachycardias induced.  There was no AH jump or echo beats.  Isoproterenol was allowed to wash out.  Rapid atrial pacing was again  performed and 3 beats of nonsustained atrial tachycardia were observed  when pacing at 420 milliseconds.  The P-wave during the tachycardia was  identical to the sinus P-wave and was felt to represent short  nonsustained reentrant atrial tachycardia.  The tachycardia cycle length  was 435 milliseconds.  The tachycardia was not sustainable despite  further atrial pacing and could not be electroanatomically mapped.  Isoproterenol was allowed to wash out.  Isoproterenol was then again  infused at 1 mcg/minute and rapid atrial and rapid ventricular pacing  was again performed with no inducible sustained arrhythmias.  Again  pacing from the atrium at 420 milliseconds, the same atrial tachycardia  was observed and only 3 beats which again had a sinus P-wave morphology.  Isoproterenol was allowed to wash out.  Isoproterenol was again infused  at 0.5 mcg/minute and also 0.25 mcg/minute and allowed to washout each  with atrial and ventricular pacing with no inducible or sustainable  arrhythmias.  The patient was allowed to wake up and atrial pacing was  again performed with no inducible or sustainable arrhythmias.  Atrial  extrastimulus testing was again performed with double extrastimulus down  to 500/440/240 milliseconds which was the AV nodal ERP with no inducible  arrhythmias.  The procedure was  therefore considered completed.  All  catheters were removed and the sheaths were aspirated and flushed.  The  sheaths were removed and hemostasis was assured.  There were no early  apparent complications.   CONCLUSIONS:  1. Normal sinus rhythm at presentation.  2. No evidence of dual atrioventricular nodal physiology or accessory      pathways.  3. No inducible or sustainable arrhythmias despite isoproterenol      infusion at 0.25, 0.5, 1, and 2 mcg/minute.  4. Three beats of nonsustained atrial tachycardia were induced with a      P-wave morphology similar to the sinus P-wave.  This tachycardia      was not sustainable and could not be frequently induced.      Therefore, it could not be electroanatomically mapped.  This      tachycardia was felt to possibly represent sinus node reentry.  5. No other inducible arrhythmias were  observed.  6. No early apparent complications.      Hillis Range, MD  Electronically Signed     JA/MEDQ  D:  10/08/2008  T:  10/08/2008  Job:  [id number]   cc:   Jonelle Sidle, MD

## 2011-01-30 NOTE — H&P (Signed)
Haley [name], DEZEEUW NO.:  [id number]   MEDICAL RECORD NO.:  [id number]          PATIENT TYPE:  OBV   LOCATION:  3703                         FACILITY:  [facility]   PHYSICIAN:  Christell Faith, MD   DATE OF BIRTH:  05-09-1961   DATE OF ADMISSION:  02/19/2009  DATE OF DISCHARGE:                              HISTORY & PHYSICAL   PRIMARY CARDIOLOGIST:  Jonelle Sidle, MD   PRIMARY CARE PHYSICIAN:  Dr. Sherril Croon.   PRIMARY ELECTROPHYSIOLOGIST:  Hillis Range, MD   CHIEF COMPLAINT:  Racing heart and chest pain.   HISTORY OF PRESENT ILLNESS:  This is a 50 year old white female with a  history of both atrial tachycardia and coronary artery disease who was  washing dishes tonight after supper when she suddenly felt racing heart  similar to multiple prior SVT spells.  However, this spell was  particularly severe and was associated with intense chest pressure  radiating to the left shoulder and left arm as well as dizziness.  She  felt awful.  Her family promptly called 911, by then the patient had  taken p.r.n. diltiazem and a sublingual nitroglycerin and her symptoms  were resolved by the time the EMS crew arrived.  In the emergency  department with my encounter, she appears severely depressed, tearful,  frustrated but is without chest pain or racing heart at this time.  It  should be noted that she did check her blood sugar at home while having  her spell and it was 112.  She has been using a lot of p.r.n. diltiazem,  at least once a day, and often more than once a day for frequent tachy  palpitations.   PAST MEDICAL HISTORY:  1. Diabetes mellitus type 1, diagnosed as an infant.  2. Coronary artery disease with a history of angina.  An 80% mid RCA      lesion was initially treated with a Taxus stent in 2008 and then      there was distal stent edge restenosis treated with a XIENCE stent      in May 2010.  3. Hyperlipidemia.  4. Nephrolithiasis.  5. Frequent  palpitations and heart monitor showed atrial tachycardia      from September 2009 and EP study showed no inducible arrhythmia on      October 08, 2008.  6. GERD.   SOCIAL HISTORY:  She lives in [place] with her husband.  She is a  lifelong nonsmoker.   FAMILY HISTORY:  Mother died at age 56 of a heart attack.  Father died  at age 38 of COPD.  She has 1 brother who is not known to have coronary  disease.   ALLERGIES:  AMPICILLIN.   MEDICATIONS:  1. Aspirin 325 mg p.o. daily.  2. Plavix 75 mg p.o. daily.  3. Diltiazem 240 mg p.o. q.a.m.  4. Prilosec 20 mg p.o. daily.  5. Insulin 70/30, 20 units in the morning, 10 units in the evening.  6. Diltiazem 30 mg q.6 h. p.r.n.  7. Neurontin 100 mg p.o. at bedtime.  8. Phenergan p.r.n.  9.  Klonopin 0.25 mg p.o. b.i.d. p.r.n.   REVIEW OF SYSTEMS:  Positive for chest pain, shortness of breath,  palpitations, depression, anxiety, weakness and nausea.  Otherwise the  balance of 14 systems are reviewed, negative.   PHYSICAL EXAMINATION:  VITAL SIGNS:  Temperature 98.2, pulse 44,  respiratory rate 16, blood pressure 132/62, saturation 100% on 2 L.  GENERAL:  This is a thin, frail looking white female who appears much  older than her stated age.  HEENT:  Pupils are round, and reactive.  Sclerae are clear.  Dentition  is poor.  Mucous membranes are moist.  NECK:  Supple without lymphadenopathy.  Neck veins are flat.  No carotid  bruits.  CARDIAC:  Bradycardic rate, regular rhythm.  No murmur.  LUNGS:  Clear to auscultation bilaterally without wheezing or rales.  ABDOMEN:  Soft, nontender, nondistended.  No bruits.  EXTREMITIES:  No edema.  MUSCULOSKELETAL:  No acute joint effusions or deformities.  NEUROLOGIC:  Positive for uncontrolled tongue movements similar of  tardive dyskinesia.   LABORATORY DATA:  White blood cell 6.4, hemoglobin 14.1, platelets 220.  CK-MB less than 1.  Troponin less than 0.05.  Sodium 143, potassium 3.6,  BUN  12, creatinine 1.1, glucose 171.  From May 2010, TSH was 1.68, LDL  54, HDL 79.   IMPRESSION:  A 50 year old white female with diabetes mellitus type 1,  coronary artery disease and most recently palpitations.   PLAN:  1. The plan will be to admit her to a telemetry monitor bed.  Rule out      myocardial infarction by cycling serial EKGs and cardiac enzymes.  2. She will need to continue dual antiplatelet therapy with aspirin,      Plavix for her recently placed stent on May 28.  3. Continue scheduled diltiazem as well as p.r.n. diltiazem for rate      and rhythm control.  4. Continue her on a statin.  5. She will need strict diabetes control given that she is type 1.  We      will schedule her 70/30 insulin and cover with mealtime coverage.  6. We will check a magnesium level and a hemoglobin A1c level.  7. DVT prophylaxis with subcu heparin.      Christell Faith, MD  Electronically Signed     NDL/MEDQ  D:  02/20/2009  T:  02/20/2009  Job:  [id number]

## 2011-01-30 NOTE — Assessment & Plan Note (Signed)
[facility]                          [facility] OFFICE NOTE   Haley [name], Haley [name]                      MRN:          [id number]  DATE:03/09/2009                            DOB:          04-21-1961    PRIMARY CARDIOLOGIST:  Jonelle Sidle, MD   PRIMARY ELECTROPHYSIOLOGIST:  Hillis Range, MD   REASON FOR VISIT:  Post-hospital followup.   [name] is a 50 year old female, with history of coronary artery  disease and ectopic atrial tachycardia, who was recently hospitalized on  2 separate occasions at [facility].   In the first instance, she presented directly to [facility] with  complaint of chest pain, ruled out for myocardial infarction, and was  found to have 80% stenosis in the distal edge of the previously stented  segment of the right coronary artery.  She had, otherwise,  nonobstructive disease of the LAD and circumflex arteries.  Left  ventricular function was normal, with no focal wall motion  abnormalities.   The patient underwent subsequent successful stenting with a Xience drug-  eluting stent to the mid RCA, with no noted complications.  She was  discharged the following day.   However, she then returned back to [facility] on June 5, at this time  with complaint of tachy palpitations as well as chest pain.  Again, she  ruled out for myocardial infarction.  No medication adjustments were  made, and she was discharged on the same dose of Cardizem 240 mg daily.      Gene Serpe, PA-C       Jonelle Sidle, MD    GS/MedQ  DD: 03/09/2009  DT: 03/10/2009  Job #: [id number]   cc:   Doreen Beam, MD

## 2011-01-30 NOTE — Discharge Summary (Signed)
[name], Haley [name]               ACCOUNT NO.:  [id number]   MEDICAL RECORD NO.:  [id number]          PATIENT TYPE:  INP   LOCATION:  2009                         FACILITY:  [facility]   PHYSICIAN:  Veverly Fells. Excell Seltzer, MD  DATE OF BIRTH:  [birth year]   DATE OF ADMISSION:  04/10/2009  DATE OF DISCHARGE:  04/12/2009                               DISCHARGE SUMMARY   PROCEDURES:  None.   PRIMARY FINAL DISCHARGE DIAGNOSIS:  1. Chest pain, cardiac enzymes negative for myocardial infarction and      outpatient followup scheduled.   SECONDARY DIAGNOSES:  1. Status post drug-eluting stent to the right coronary artery in      2008, percutaneous intervention for in-stent restenosis in May      2010.  2. Insulin-dependent diabetes.  3. Hyperlipidemia.  4. Gastroesophageal reflux disease.  5. Chronic pain syndrome.  6. Allergy or intolerance to AMPICILLIN.  7. Family history of coronary artery disease in her mother.  8. Paroxysmal atrial tachycardia.  9. Bradycardia, on diltiazem.   HOSPITAL COURSE:  [name] is a 50 year old female with a history of  coronary artery disease.  She came to the hospital for chest pain and  was admitted for further evaluation.   Her cardiac enzymes were negative for MI.  Her blood sugars were  followed closely.  She was placed on sliding scale insulin in addition  to her home dosages.  A chest x-ray showed no acute processes.  She had  been on diltiazem 120 mg daily prior to admission.  This was continued,  but she had some significant bradycardia.  The daily dose was stopped,  but she still has a prescription for 30 mg of the short-acting Cardizem  to use for tachy palpitations.   On April 12, 2009, [name] was ambulating without chest pain or  shortness of breath.  Her chest pain was atypical and it resolved.  Dr.  Excell Seltzer felt that no further cardiovascular workup was indicated at this  time.  Dr. Excell Seltzer evaluated [name] on April 12, 2009, and felt  that  she was stable for discharge, to be followed as an outpatient in [place].   DISCHARGE INSTRUCTIONS:  Her activity levels to be increased gradually.  She is to stick to a low-sodium, heart-healthy diet.  She is to follow  up with Dr. Diona Browner on May 06, 2009, at 2:15 with Dr. Sherril Croon as  needed.   DISCHARGE MEDICATIONS:  Per the computerized printout.      Theodore Demark, PA-C      Veverly Fells. Excell Seltzer, MD  Electronically Signed    RB/MEDQ  D:  04/12/2009  T:  04/13/2009  Job:  [id number]   cc:   Doreen Beam, MD

## 2011-01-30 NOTE — Letter (Signed)
September 27, 2008    Jonelle Sidle, MD  [phone]  [place], [place] [address]   RE:  Haley [name], Haley [name]  MRN:  [id number]  /  DOB:  1961-05-20   Dear Doreatha Martin,   It was my pleasure to see your patient Margo Lama in the  Electrophysiology consultation today regarding supraventricular  tachycardia.  As you recall, Ms. Advincula is a very pleasant 50 year old  female with a history of diabetes, coronary artery disease, and recently  diagnosed supraventricular tachycardia.  She reports that since October,  she has had episodes of abrupt onset and termination of heart racing.  She is unaware of any triggers or precipitance for this events.  They  typically occur at night and have awakened her from sleeping.  They  initially lasted 5 minutes and occurred sporadically.  They have  subsequently increased in both frequency and duration and presently  occur several times per day, lasting approximately 15 minutes.  She has  taken propranolol without success or improvement in her symptoms.  She  has subsequently developed sinus bradycardia with this medication.  She  reports symptoms of heart racing, fatigue, lightheadedness, dizziness,  and shortness of breath during tachycardia.  She occasionally has  associated chest discomfort with radiation into her left hand.  She  recently had a Lexiscan Myoview to evaluate this which was normal.  She  is unaware of any maneuvers which terminate her symptoms.  She was  evaluated in your office and a CardioNet monitor was placed on June 15, 2008 through July 17, 2008.  This revealed episodic atrial  tachycardia.  Her lungs are episodic atrial tachycardia with heart rates  during tachycardia of approximately 140 beats per minute.  During sinus  rhythm, the patient's rhythm is typically sinus bradycardia with heart  rates as low as 40 beats per minute on propranolol.  No episodes of  atrial fibrillation have been documented.  She  is otherwise without  complaint today.   PAST MEDICAL HISTORY:  1. Atrial tachycardia (as above).  2. Coronary artery disease, status post percutaneous coronary      intervention to the right coronary artery in May 2008.  A followup      left heart catheterization in March 22, 2007 revealed nonobstructive      coronary artery disease with a widely patent stent to the mid right      coronary artery.  The left ventricular ejection fraction was      normal.  A Lexiscan Myoview has subsequently revealed an ejection      fraction of 40% with normal perfusion.  3. Diabetes mellitus with chronic peripheral neuropathy.  4. Hyperlipidemia.  5. Gastroesophageal reflux disease.  6. Degenerative arthritis, status post right knee surgery.  7. Degenerative disk disease.  8. Status post total abdominal hysterectomy.  9. Status post cholecystectomy.   CURRENT MEDICATIONS:  1. Propranolol 60 mg daily.  2. Simvastatin 40 mg nightly.  3. Aspirin 81 mg daily.  4. Prilosec 20 mg daily.  5. Humulin 70/30 19 units q.a.m. and 10 units q.p.m.  6. Sublingual nitroglycerin p.r.n.   ALLERGIES:  PENICILLIN causes blackouts.   SOCIAL HISTORY:  The patient was in [place] with her spouse.  She is  unemployed.  She denies tobacco, alcohol, or drug use.   FAMILY HISTORY:  Notable for coronary artery disease.   REVIEW OF SYSTEMS:  All systems are reviewed and negative except as  outline in the HPI  above.   PHYSICAL EXAMINATION:  VITALS:  Blood pressure 120/78, heart rate 52,  respirations 18, and weight 124 pounds.  GENERAL:  The patient is a thin female in no acute distress.  She is  alert and oriented x3.  HEENT:  Normocephalic, atraumatic.  Sclerae clear.  Conjunctivae pink.  Oropharynx clear.  NECK:  Supple.  No lymphadenopathy, thyromegaly, or bruits.  LUNGS:  Clear to auscultation bilaterally.  HEART:  Regular rate and rhythm.  No murmurs, rubs, or gallops.  GI:  Soft, nontender, and nondistended.   Positive bowel sounds.  EXTREMITIES:  No clubbing, cyanosis, or edema.  NEUROLOGIC:  Strength and sensation are intact.  SKIN:  No ecchymosis or lacerations.  MUSCULOSKELETAL:  No deformity or atrophy.  PSYCHIATRIC:  Euthymic mood.  Flat affect.   EKG reveals sinus bradycardia at 53 beats per minute with an incomplete  right bundle-branch block, otherwise unremarkable.   IMPRESSION:  Ms. Coverdale is a pleasant 50 year old female with a history  of diabetes, coronary artery disease, and recently diagnosed symptomatic  ectopic atrial tachycardia, who presents today for EP consultation  regarding therapeutic strategies for atrial tachycardia.  Therapeutic  strategies for supraventricular tachycardia including both medicine and  catheter-based therapies were discussed in detail with the patient  today.  Risks, benefits, and alternatives to EP study and radiofrequency  ablation for supraventricular tachycardia were also discussed.  These  risk include, but are not limited to bleeding, vascular damage,  pericardial effusion with tamponade, cardiac perforation, damage to the  AV conduction system requiring a pacemaker, damage to structures  surrounding the heart, and stroke.  The patient understands these risks  and wishes to proceed.   PLAN:  We will therefore schedule the patient for EP study and  radiofrequency ablation for atrial tachycardia in the near future.  I  have made no medication changes today, but I have instructed the patient  to hold her propranolol for 72 hours prior to her ablation.    Sincerely,      Hillis Range, MD  Electronically Signed    JA/MedQ  DD: 09/27/2008  DT: 09/28/2008  Job #: [id number]   CC:    Doreen Beam, MD

## 2011-01-30 NOTE — Assessment & Plan Note (Signed)
[facility]                          [facility]ICE NOTE   Haley [name], Haley [name]                      MRN:          [id number]  DATE:08/25/2008                            DOB:          August 09, 1961    PRIMARY CARE PHYSICIAN:  Doreen Beam, MD   REASON FOR VISIT:  Scheduled followup.   HISTORY OF PRESENT ILLNESS:  I last saw Haley [name] in the office back in  May 2008.  She has a history of single-vessel coronary artery disease,  status post drug-eluting stent placement to the right coronary artery in  May 2008 and has completed a years course of dual-antiplatelet therapy.  More recently, she has been troubled by recurrent atypical chest pain  associated with a sense of rapid forceful palpitations and  breathlessness.  These episodes are sporadic and may last anywhere from  5-10 minutes, sometimes more prominent in the evening hours.  She states  that she feels as if she needs to cough when these episodes occur.  She  has had no frank syncope associated with them, however.  In reviewing  recent followup in our clinic and also outpatient event recording, I see  that Haley [name] has evidence of paroxysmal atrial tachycardia with a  heart rate of approximately 140 beats per minute, sudden onset, and also  alternating with sinus bradycardia, sometimes down to the 40s.  She was  seen by Mr. Serpe along with Dr. Andee Lineman back in October, following an  outpatient event recording and was placed on Inderal 60 mg daily.  She  has not noticed any substantial improvement following this and comes  back in today to discuss her symptoms.   ALLERGIES:  AMPICILLIN.   MEDICATIONS:  1. Aspirin 81 mg p.o. daily.  2. Prilosec 20 mg p.o. daily.  3. Humulin 70/30 19 units subcu q.a.m. and 10 units subcu q.p.m.  4. Clonapam 0.5 mg p.o. b.i.d.  5. Propranolol 60 mg p.o. q.a.m.  6. Simvastatin 40 mg p.o. nightly.  7. Sublingual nitroglycerin 0.4 mg p.r.n.   REVIEW OF  SYSTEMS:  As outlined above.  She is not reporting any clearly  reproducible exertional angina.  Her chest pain is more atypical in  description and typically associated with her palpitations.  She did  have a Myoview done through [facility] in October of this  year, which revealed normal perfusion with an ejection fraction  calculated at 40% at that time, although not corroborated with  echocardiographic findings.   REVIEW OF SYSTEMS:  As outlined above.  Otherwise, negative.   PHYSICAL EXAMINATION:  VITAL SIGNS:  Blood pressure today is 105/62,  heart rate is 50 and regular, weight is 123 pounds.  GENERAL:  This is a thin woman, anxious appearing, in no acute distress.  NECK:  No elevated jugular venous pressure.  No loud bruits.  No  thyromegaly.  LUNGS:  Clear without labored breathing.  CARDIAC:  Regular rate and rhythm.  Soft S4.  No pathologic systolic  murmur or pericardial rub.  ABDOMEN:  Soft and nontender.  Bowel sounds are present.  EXTREMITIES:  Exhibit no frank pitting edema.   IMPRESSION/RECOMMENDATIONS:  Atypical chest pain, typically in  association with a sense of rapid palpitations, sporadic, and with event  recorder documentation of paroxysmal atrial tachycardia with rates  nearing 150 beats per minute.  This also seems to alternate with sinus  bradycardia down to the 40s and her symptoms have not improved following  initiation of Inderal 60 mg daily at her last visit.  She is not having  any dizziness or syncope.  She did have a reassuring ischemic evaluation  through [facility] back in October, although there was  documentation of an ejection fraction of 40% at that time by myocardial  perfusion imaging.  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  Further plans to follow.     Jonelle Sidle, MD  Electronically Signed    SGM/MedQ  DD: 08/25/2008  DT: 08/26/2008  Job #: [id number]   cc:   Doreen Beam, MD

## 2011-01-30 NOTE — Cardiovascular Report (Signed)
NAMEARVIS, MIGUEZ NO.:  [id number]   MEDICAL RECORD NO.:  [id number]          PATIENT TYPE:  INP   LOCATION:  2502                         FACILITY:  [facility]   PHYSICIAN:  Verne Carrow, MDDATE OF BIRTH:  11-01-1960   DATE OF PROCEDURE:  02/11/2009  DATE OF DISCHARGE:                            CARDIAC CATHETERIZATION   PRIMARY CARDIOLOGIST:  Jonelle Sidle, MD   PRIMARY ELECTROPHYSIOLOGIST:  Hillis Range, MD   PROCEDURE PERFORMED:  1. Left heart catheterization.  2. Selective coronary angiography.  3. Left ventricular angiogram.  4. Percutaneous coronary intervention with placement of a Xience drug-      eluting stent in the mid right coronary artery.  5. Placement of an Angio-Seal femoral artery closure device.   OPERATOR:  Verne Carrow, MD   INDICATION:  Chest pain in a 50 year old Caucasian female with a known  history of coronary artery disease.   DETAILS OF PROCEDURE:  The patient was brought to the inpatient cardiac  catheterization laboratory after signing informed consent for the  procedure.  The right groin was prepped and draped in the sterile  fashion.  A 1% lidocaine was used for local anesthesia.  A 5-French  sheath was inserted into the right femoral artery without difficulty.  Standard diagnostic catheters were used to perform selective coronary  angiography.  A 5-French pigtail catheter was used to perform a left  ventricular angiogram.  The patient tolerated the procedure well.   We elected to proceed to intervention of the stenosis in the distal  portion of the stented segment in the mid right coronary artery.  Portion of this lesion actually was out the stent as well on the distal  edge of the stent.  The patient was given 600 mg of Plavix on the cath  table as well as 20 mg of Pepcid.  An Angiomax bolus was given and drip  was started.  Once the ACT was greater than 200, I selectively engaged  the right coronary  artery with [facility] guiding catheter.  A Cougar  intracoronary wire was then passed down the right coronary artery  without difficulty.  A 2.5 x 8 mm balloon was then inflated within the  area of tightest stenosis within the stent.  A 2.5 x 12 mm Xience drug-  eluting stent was then deployed in the mid right coronary artery  extending from the distal extended portion down into the native vessel  just beyond the extended portion.  A 2.5 x 8 mm noncompliant balloon was  then inflated to 16 atmospheres x2 inflations inside the stent that had  just been placed.  There was an excellent angiographic result.  Stenosis  was 80% prior to the intervention and 0% following the intervention.  The patient tolerated the procedure well and was taken to the holding  area in stable condition.   HEMODYNAMIC FINDINGS:  Central aortic pressure 126/55, left ventricular  pressure 124/1, left ventricular end-diastolic pressure 17.   ANGIOGRAPHIC FINDINGS:  1. The left main coronary artery no evidence of disease.  2. The left anterior descending is a large vessel that  courses to the      apex and gives out a moderate-to-large sized diagonal branch.      There is no angiographic evidence of disease in this system.  3. Circumflex artery gives off 2 moderate size marginal branches.      There is no angiographic evidence of disease in this system.  4. The right coronary artery is a dominant vessel that has a stent in      the midportion of the vessel.  There is a 80% stenosis in the      distal edge of the stented segment that extends down into the      native vessel just off the edge of the stent.  There are no other      significant lesions noted in this vessel.  5. Left ventricular angiogram was performed in the RAO projection      shows normal systolic function with no wall motion abnormalities.      Ejection fraction of 60%.   IMPRESSION:  1. Single-vessel coronary artery disease.  2. Normal left ventricular  systolic function.  3. Successful percutaneous coronary intervention with placement of      Xience drug-eluting stent in the mid right coronary artery.   RECOMMENDATIONS:  The patient should be continued aspirin, Plavix for at  least 1 year.  We will also continue her beta-blocker and statin  therapy.  The patient should wear an outpatient event monitor.  This  will be arranged prior to discharge tomorrow.      Verne Carrow, MD  Electronically Signed     CM/MEDQ  D:  02/11/2009  T:  02/12/2009  Job:  [id number]   cc:   Hillis Range, MD  Jonelle Sidle, MD

## 2011-01-30 NOTE — Assessment & Plan Note (Signed)
[facility]                          [facility] OFF[facility] NOTE   Haley [name], Haley [name]                      MRN:          [id number]  DATE:04/14/2007                            DOB:          06-11-1961    PRIMARY CARDIOLOGIST:  Dr. Diona Browner.   PRIMARY CARE PHYSICIAN:  Dr. Sherril Croon.   SUMMARY OF HISTORY:  Haley [name] is a 50 year old white female that was  recently hospitalized from July 15 to July 16 at [facility] with  reoccurring chest discomfort.  Haley [name] underwent a cardiac catheterization  that did not show any restenosis of her RCA stent that was placed in  May, EF was 60%.  Since her hospitalization Haley [name] states that Haley [name]  has been doing well.  Haley [name] has not had any difficulty with cardiac  catheterization site, however yesterday evening while watching TV around  9:30 with her husband Haley [name] suddenly developed a sharp pain in her  anterior chest associated with nausea.  This did radiate straight  through to her left shoulder blade.  Haley [name] did not have any associated  shortness of breath, vomiting, gas or diaphoresis.  Haley [name] did take a  sublingual nitroglycerin about 20 minutes into the discomfort and the  discomfort resolved.  Haley [name] felt that it was similar to May and to her  recent admission that Haley [name] just underwent a cardiac catheterization.  For  dinner about 2 hours earlier Haley [name] had eaten meatloaf, potatoes, peas and  biscuits which is not an unusual meal for her.  Haley [name] states that Haley [name] has  been exercising daily and has not had any difficulties with chest  discomfort or exertional limitations.   An EKG in the office today shows sinus brady with a ventricular rate of  52, early repolarization without acute changes.   PAST MEDICAL HISTORY:  ALLERGIES:  INCLUDE AMPICILLIN.   MEDICATIONS:  1. Prilosec 20 mg daily.  2. Simvastatin 20 mg nightly.  3. Aspirin 325 mg daily.  4. Plavix 75 mg daily.  5. Humulin 70/30 fifteen units q.a.m., 6 units q.p.m.  6. Nitroglycerin 0.4 p.r.n.   HISTORY:  1. Type 1 diabetes, her sugars have been excellently controlled.  2. Recent diagnosis of hyperlipidemia, statin was started in May and      Haley [name] states that her cholesterol has been rechecked and is doing      well.  3. Also has a history of an abnormal Myoview leading to cardiac      catheterization in May and stenting to the RCA.  Echocardiogram      May 31, 2005 shows an EF 55% without wall motion      abnormalities.   PHYSICAL EXAMINATION:  GENERAL:  Well-nourished, well-developed,  pleasant white female, no apparent distress.  Blood pressure 116/70,  heart rate 54 and regular, weight 126.  HEENT:  Unremarkable.  NECK:  Supple without thyromegaly, adenopathy, JVD or carotid bruits.  CHEST:  Symmetrical excursion.  LUNGS:  Clear to auscultation.  HEART:  PMI is not displaced, regular rate and rhythm, normal S1 and S2.  Did not appreciate any murmurs, rubs, clicks or  gallops.  ABDOMEN:  Unremarkable.  EXTREMITIES:  Negative cyanosis, clubbing or edema.  Peripheral pulses  are symmetrical and intact.  Catheterization site does not show any  evidence of hematoma, ecchymosis, bruit and is nontender.   IMPRESSION:  Chest discomfort of uncertain etiology.  Haley [name] does not  appear to be cardiac unstable.  Her chest discomfort that Haley [name] has had  prior to her stenting has significantly improved.  I reassured her that  her EKG does not show any acute changes.  I suggested Haley [name] continue  documentation of any further episodes of chest discomfort and to keep  Tums on hand as well as some Tylenol.  I have also reiterated to her the  rules of nitroglycerin, 1 tablet every 5 minutes for chest discomfort  and if unrelieved after 3 tablets within a 15-20 minute time period Haley [name]  needs to proceed to the nearest emergency room for further evaluation.  Haley [name] states that Haley [name] will do so.  Haley [name] does not have any questions about  her recent hospitalization cath or  stenting and we will see her again in  approximately 6 months.  I have encouraged her to please call us back if  Haley [name] does have any questions or problems, Haley [name] agreed.      Joellyn Rued, PA-C  Electronically Signed      Luis Abed, MD, [facility]  Electronically Signed   EW/MedQ  DD: 04/14/2007  DT: 04/14/2007  Job #: [id number]

## 2011-01-30 NOTE — Discharge Summary (Signed)
NAMEMarland Kitchen  Haley, [name] NO.:  [id number]   MEDICAL RECORD NO.:  [id number]          PATIENT TYPE:  INP   LOCATION:  2502                         FACILITY:  [facility]   PHYSICIAN:  Haley Sidle, MD DATE OF BIRTH:  Jan 25, 1961   DATE OF ADMISSION:  02/10/2009  DATE OF DISCHARGE:  02/12/2009                               DISCHARGE SUMMARY   PRIMARY CARDIOLOGIST:  Haley Sidle, MD.   ELECTROPHYSIOLOGIST:  Haley Range, MD.   PRIMARY CARE Haley [name]:  Haley Beam, MD.   DISCHARGE DIAGNOSES:  Coronary artery disease, status post cardiac  catheterization revealing 80% mid right coronary artery in-stent  restenosis, status post successful percutaneous coronary  intervention/drug-eluting stent placement.   SECONDARY DIAGNOSES:  1. Insulin-dependent diabetes mellitus.  2. Hyperlipidemia.  3. Nephrolithiasis.  4. Gastroesophageal reflux disease.  5. Chronic pain syndrome.  6. History of atrial tachycardia.      a.     Status post electrophysiologic study showing no inducible or       sustainable arrhythmias.  7. Status post cholecystectomy.  8. Status post hysterectomy.   ALLERGIES:  AMPICILLIN (blackouts).   PROCEDURES PERFORMED DURING THIS HOSPITALIZATION:  1. EKG showing sinus bradycardia at a rate of 47, normal axis, no      acute ST-T wave changes.  2. A chest x-ray showing mild emphysematous changes without acute      abnormalities.  3. Cardiac catheterization.  4. Successful PCI/DES of mid RCA using a Xience DES.   HISTORY OF PRESENT ILLNESS:  Haley [name] is a 50 year old Caucasian  female with the above problem list.  Over the past week, she has been  having daily 6/10 to 8/10 substernal chest pressure with rest and with  exertion associated with shortness of breath, nausea, lightheadedness,  diaphoresis, and radiation down the left arm.  Symptoms are similar to  previous angina.  Symptoms lasts approximately 50 minutes, resolving  with 1 sublingual  nitroglycerin.  Chest pain symptoms recurred 3-4 times  per day.  Also over the past week, she has been having increasing  frequency of palpitations associated with dyspnea.  Palpitations  occurred 3-4 times per day, but never with chest pain.  She usually  takes a p.r.n. 30 mg diltiazem tablet with the first episode and her  palpitations ease off 45 minutes.  Despite this, they will recur 3-4  times per day.  Due to progressive symptoms, the patient decided to  present to the ED for evaluation.  In the ED, she was given  nitroglycerin and morphine with significant improvement of her chest  pain.   [name] COURSE:  The patient was admitted and underwent procedures as  described above.  The patient tolerated them well without significant  complications.  The patient had uneventful [name] course other than  described in procedure section other than 2 episodes of atrial  tachycardia lasting less than 2 minutes each.  The patient's only  symptoms were palpitations.  These symptoms are no different than her  previous episodes that had been documented in her history.  On Feb 11, 2009, the patient  enrolled in the [employer] and [place] registry clinical  trial.  The patient was informed of the risks and benefits of study  participation prior to signing the informed consent form.  The patient  was given a copy of her signed informed consent form prior to any study  specific procedures being completed.  The patient's discharge is  pending.  Ambulation without symptoms with cardiac rehab phase I.  At  this point, medications will remain the same except for the addition of  full-dose aspirin and Plavix daily for at least 1 year.  The patient  will follow up with Dr. Nona [name] in [place] in approximately 2 weeks  as well as her electrophysiologist, Dr. Johney [name] in approximately 2 weeks.  The patient will receive her new medication list, prescriptions,  followup instructions, and post cath  instructions at time of discharge.  All questions and concerns will be addressed at that time.   Should any changes to this plan be made after attending review, please  see addendum.   DISCHARGE LABS:  WBC 6.6, HGB 13.0, HCT 38.1, PLT count 166.  The  patient had 3 sets of negative point-of-care markers and 3 full sets of  negative cardiac enzymes.  Sodium 140, potassium 4.0, chloride 105, CO2  of 29, BUN 12, creatinine 0.72, glucose 149, calcium 9.5, magnesium 2.3,  total cholesterol 136, triglyceride 16, HDL is 79, LDL 54, VLDL 3, TSH  1.668.   FOLLOWUP PLANS AND APPOINTMENTS:  1. Haley [name] approximately 2 weeks (office will call).  2. Haley [name] 2-3 weeks (office will call).   DISCHARGE MEDICATIONS:  1. Prilosec 20 mg p.o. daily.  2. Enteric-coated aspirin 325 mg p.o. daily.  3. Plavix 75 mg p.o. daily.  4. Humulin 70/30 insulin 90 units subcu injection q.a.m. and 10 units      subcu injection at bedtime.  5. Klonopin 0.5 mg p.o. b.i.d. p.r.n. for anxiety.  6. Simvastatin 40 mg p.o. at bedtime.  7. Diltiazem 30 mg q.6 h p.r.n. for palpitations.  8. Gabapentin 100 mg p.o. at bedtime.  9. Phenergan 25 mg q.6 h p.r.n.  10.Nitroglycerin 0.4 mg sublingually p.r.n. for chest pain.   Duration of discharge encounter, which is 45 minutes including physician  time.      Haley [name], Haley [name]      Haley Sidle, MD  Electronically Signed    MS/MEDQ  D:  02/12/2009  T:  02/13/2009  Job:  [id number]   cc:   Haley Range, MD  Haley Beam, MD

## 2011-01-30 NOTE — Assessment & Plan Note (Signed)
[facility]                          [facility]ICE NOTE   Haley, [name]                      MRN:          [id number]  DATE:02/05/2007                            DOB:          Nov 24, 1960    PRIMARY CARE PHYSICIAN:  Haley Beam, MD   REASON FOR VISIT:  Post hospitalization followup.   HISTORY OF PRESENT ILLNESS:  Haley [name] was seen in the hospital as an  inpatient consult back on the first of May by Dr.  Andee [name]. She presented  with chest pain that was recurrent and with a history of previously  reassuring non-invasive cardiac testing. Risk factors included  longstanding diabetes mellitus and she was referred for a diagnostic  cardiac catheterization, which revealed reassuring left system disease,  but an 80% stenosis involving the right coronary artery with overall  ejection fraction of 60%. Dr.  Juanda [name] evaluated this and placed a TAXUS  drug-eluting stent in the right coronary artery at that time. She  tolerated this well and today states that she feels good without any  significant chest pain. She took a 2-mile walk recently with her husband  and is not having any angina. I spoke with her about the critical  importance of continuing her Plavix, which was recommended for at least  a year by Dr.  Juanda [name]. Obviously, also continued control of diabetes and  her lipid status is of significant importance. She is not reporting any  problems with her catheterization site.   ALLERGIES:  PENICILLIN.   CURRENT MEDICATIONS:  1. Aspirin 325 mg p.o. daily.  2. Simvastatin 20 mg p.o. q nightly.  3. Plavix 75 mg p.o. daily.  4. Humulin 70/30 15 units subcutaneous q a.m. and 6 units subcutaneous      q p.m.   REVIEW OF SYSTEMS:  As described in History of Present Illness.  Otherwise, negative.   PHYSICAL EXAMINATION:  Blood pressure 104/66, heart rate is 64, weight  is 123.6 pounds. The patient is comfortable and in no acute distress.  NECK: Reveals no elevated jugular venous pressure, without bruits. No  thyromegaly is noted.  LUNGS:  Are clear without labored breathing at rest.  CARDIAC: Reveals a regular rate and rhythm without loud murmur, rub, or  gallop.  EXTREMITIES: Show no significant pitting edema.  SKIN: Warm and dry.   IMPRESSIONS AND RECOMMENDATIONS:  1. Single vessel coronary artery disease with preserved ejection      fraction, status post TAXUS drug-eluting stent placement to the      right coronary artery as outlined. Plan will be to continue medical      therapy and Plavix for at least a year. I will see her back for      symptom review in the next three or four months.  2. Diabetes mellitus and hyperlipidemia, followed by Dr.  Sherril [name].      Suggest aggressive lipid control with LDL around 70.     Haley Sidle, MD  Electronically Signed    SGM/MedQ  DD: 02/05/2007  DT: 02/05/2007  Job #: [id number]   cc:   Haley [name]  Haley [name]

## 2011-02-02 NOTE — Discharge Summary (Signed)
NAMEDESTENY, [name] NO.:  [id number]   MEDICAL RECORD NO.:  [id number]          PATIENT TYPE:  INP   LOCATION:  6532                         FACILITY:  [facility]   PHYSICIAN:  Jonelle Sidle, MD DATE OF BIRTH:  [birth year]   DATE OF ADMISSION:  01/18/2007  DATE OF DISCHARGE:  01/21/2007                               DISCHARGE SUMMARY   PRIMARY CARDIOLOGIST:  Dr. Simona Huh.   PRIMARY CARE PHYSICIAN:  Dr. Doreen Beam.   DISCHARGE DIAGNOSES:  1. Coronary artery disease, status post placement of a drug-eluting      stent to the right coronary artery on Jan 20, 2007.  Left      ventricular ejection fraction 60%.  2. Diabetes.  3. LDL of 91.   PAST MEDICAL HISTORY:  Includes:  1. Diabetes type 1.  2. Mononeuritis multiplex.  3. Status post hysterectomy.  4. Status post right knee surgery.  5. Status post cholecystectomy.   PROCEDURES THIS ADMISSION:  Include cardiac catheterization/PCI on Jan 20, 2007.   HOSPITAL COURSE:  [name] is a 50 year old Caucasian female with  medical history as stated above, who started having chest pain earlier  this week.  Previous stress echo and stress Myoview in 2003 and 2004  were negative for ischemia.  This pain started on April 29.  She  described it as heaviness with palpitations, rated a 10/10 in severity.  Had 2 episodes that woke her from sleep.  She was admitted to [facility] and ruled out for an MI.  Patient already had an appointment  scheduled with [facility] on May 5, however, she had similar  episodes of chest discomfort that was relieved with nitroglycerin and  EMS was called, patient was transported to [facility] for further  evaluation.  Patient ruled out for an MI, but had recurrent episodes of  chest discomfort.  Proceeded with cardiac catheterization on Jan 20, 2007, results as stated above.  Patient tolerated procedure without  complications.  Dr. Juanda Chance recommended Plavix x1 year.   Postcath, Dr.  Diona Browner, went to see patient on the 6th of May.  No further episodes of  chest discomfort.  Lab work stable.  Potassium 4.3, BUN and creatinine  13 and 0.9, H&H 13.5 and 39.8.  Patient with mildly elevated LDL was 91  in the setting of diabetes.   The patient being discharged home with:  1. Simvastatin 20 mg daily.  2. Plavix 75 mg daily.  3. Insulin 70/30 as previously prescribed.  4. Aspirin 325 mg daily.   Patient has been given a postcardiac catheterization discharge  instructions.  She is to follow up with Dr. Diona Browner, May 21, at 2 p.m.  She will need blood work drawn in 6 weeks to check liver and lipids.  This needs to be arranged at followup appointment.   Duration of discharge encounter was 35 minutes.      Dorian Pod, ACNP      Jonelle Sidle, MD  Electronically Signed    MB/MEDQ  D:  01/21/2007  T:  01/21/2007  Job:  [id number]   cc:   Doreen Beam

## 2011-02-02 NOTE — Cardiovascular Report (Signed)
Haley [name], Haley [name]               ACCOUNT NO.:  [id number]   MEDICAL RECORD NO.:  [id number]          PATIENT TYPE:  INP   LOCATION:  6532                         FACILITY:  [facility]   PHYSICIAN:  Everardo Beals. Juanda Chance, MD, FACCDATE OF BIRTH:  Jun 03, 1961   DATE OF PROCEDURE:  01/20/2007  DATE OF DISCHARGE:                            CARDIAC CATHETERIZATION   CLINICAL HISTORY:  [name] is 50 year old and has insulin-dependent  diabetes, but no prior history of known heart disease.  She was recently  admitted to [facility] with chest pain and was scheduled for a  Myoview scan, but developed recurrent chest pain and was admitted the  hospital and was transferred to [place] for evaluation and angiography.  Her  EKG was normal and her cardiac markers were negative.   PROCEDURE:  The procedure was performed via the right femoral artery and  arterial sheath and initially 5-French diagnostic catheters.  After  completion of the diagnostic study and after reviewing the films with  Dr. Excell Seltzer, we made a decision to intervene on the lesion in the mid-  right coronary artery.   The patient was given Angiomax bolus and infusion.  She was given 600 mg  of Plavix and had previously been given four chewable baby aspirin.  We  chose a JR-4, 6-French guiding catheter with side holes.  We navigated  the Prowater wire down the right coronary across the lesion in the  midvessel.  We then direct stented the lesion with a 2.5 x 16-mm, Taxus  stent deploying this with one inflation of 14 atmospheres for 30  seconds.  We then postdilated with a 2.75 x 12-mm, Quantum Maverick  performing two inflations up to 16 atmospheres x30 seconds.  Final  diagnostic were then performed through the guiding catheter.  The  patient tolerated the procedure well and left the laboratory in  satisfactory condition.  We did not close the right femoral artery  because she was very thin.   RESULTS:  The left main coronary artery  was a short vessel that was free  of significant disease.   Left anterior descending artery gave rise to two diagonal branches and  septal perforator.  These and the LAD were free of significant disease.   The circumflex artery gave rise to a marginal branch and two  posterolateral branches.  These vessels were free of significant  disease.   The right coronary artery was a moderate-sized vessel that gave rise to  a right ventricle branch, posterior descending branch and two  posterolateral branches.  There was 80% narrowing in the mid-right  coronary just after the right ventricular branch.   The left ventricle from the RAO projection shows good wall motion with  no areas of hypokinesis.  The estimated fraction was 60%.   The aortic pressure was 127/61 with mean of 87.  The left ventricular  pressure is 127/12.   CONCLUSION:  1. Coronary artery disease with 80% in the mid right coronary artery,      no significant obstruction of circumflex and right coronary      arteries and normal  LV function.  2. Successful PCI of the lesion in the mid right coronary using a      Taxus drug-eluting stent with improvement in narrowing from 80% to      0%.   DISPOSITION:  The patient returned to recovery room for further  observation.      Bruce Elvera Lennox Juanda Chance, MD, [facility]  Electronically Signed     BRB/MEDQ  D:  01/20/2007  T:  01/20/2007  Job:  [id number]   cc:   Doreen Beam

## 2011-02-02 NOTE — H&P (Signed)
NAMEKAREM, FARHA NO.:  [id number]   MEDICAL RECORD NO.:  [id number]          PATIENT TYPE:  INP   LOCATION:  1825                         FACILITY:  [facility]   PHYSICIAN:  Lowella Bandy, MD      DATE OF BIRTH:  1960-12-12   DATE OF ADMISSION:  01/18/2007  DATE OF DISCHARGE:                              HISTORY & PHYSICAL   PRIMARY CARE Muhammad Vacca:  Dr. Doreen Beam.   PRIMARY CARDIOLOGIST:  Dr. Simona Huh, [facility].   CHIEF COMPLAINT:  Chest pain.   HISTORY OF PRESENT ILLNESS:  Haley [name] is a 50 year old white female  with type 1 diabetes mellitus, who initially started having chest pain  earlier this week.  She reports an episode of chest pain on Tuesday,  April 29, that was substernal in nature, described as a heaviness with  palpitations, 10/10 in severity, that lasted approximately 10 minutes  and resolved on its own.  She had 2 further episodes that awoke her from  sleep that night, and she was admitted to [facility] and ruled  out for myocardial infarction.  She was scheduled for followup with  [facility] with Dr. Andee Lineman on May 5.  Unfortunately, she had  another similar episode of chest pain earlier this evening that was not  relieved by 3 nitroglycerin, and EMS was called.  Chest pain eventually  was relieved on its own and she is now pain-free.  She denies any  exertional component to the chest pain and the pain generally occurs at  rest.  The pain is associated with some diaphoresis.   PAST MEDICAL HISTORY:  1. Diabetes mellitus, type 1, diagnosed at age 84.  2. History of chest pain syndrome, negative dobutamine stress echo in      September 2004, negative adenosine Cardiolite in 2003.  Normal      ejection fraction.  3. Mononeuritis multiplex.  4. Status post hysterectomy.  5. Status post right knee surgery.  6. Status post cholecystectomy.   MEDICATIONS:  1. Humulin 70/30; fifteen units subcutaneously in the  morning, 6 units      subcutaneously with dinner.  2. Aspirin 325 mg daily, just started after her recent hospital      discharge.  3. Nitroglycerin sublingual p.r.n., just started after her recent      hospital discharge.   ALLERGIES:  AMPICILLIN.   SOCIAL HISTORY:  The patient lives in [place].  She is on disability  due to chronic pain issues.   She is a nonsmoker and does not drink alcohol.   FAMILY HISTORY:  Her mother died in her 11s from suspected myocardial  infarction.  Her father died at approximately 29 from emphysema.   REVIEW OF SYSTEMS:  Positive for chronic right hip and back pain.  Otherwise, 10 systems reviewed and negative, other than as noted in the  HPI.   PHYSICAL EXAMINATION:  VITAL SIGNS:  Blood pressure 119/68, pulse 59,  oxygen saturation 100% on 2-L nasal cannula.  GENERAL:  The patient is breathing comfortably, in no apparent distress,  no active chest pain.  HEENT:  Normocephalic/atraumatic, extraocular movements intact, sclerae  anicteric, oropharynx clear, edentulous.  NECK:  Supple, no masses appreciated, no carotid bruits, no jugulovenous  distention.  CARDIOVASCULAR:  Regular rate and rhythm, normal S1 and S2, no murmurs,  rubs, or gallops.  CHEST:  Clear to auscultation bilaterally.  ABDOMEN:  Soft, nontender/non-distended.  EXTREMITIES:  Warm, no edema.  SKIN:  No rashes.  MUSCULOSKELETAL:  No joint effusions or erythema.  NEUROLOGIC:  Alert and oriented x3, cranial nerves grossly intact,  moving all extremities well.  PSYCHIATRIC:  Alert and oriented x3, affect appropriate.   RADIOLOGY AND ACCESSORY CLINICAL DATA:  EKG, which I personally  reviewed, shows normal sinus rhythm at 68 beats per minute, normal EKG.   Chest x-ray personally reviewed shows no evidence of acute  cardiopulmonary disease.   LABORATORY VALUES:  White blood cell count 6.2, hemoglobin 14.4,  hematocrit 42, platelets 219,000; sodium 143, potassium 3.5,  chloride  108, bicarb 30, BUN 13, creatinine 0.9, glucose 43.   CK-MB less than 1.0, troponin I less than 0.05.   ASSESSMENT:  A 50 year old female with type 2 diabetes mellitus with  chest pain syndrome, mostly atypical for ischemia.  Her EKG is normal  and initial cardiac enzymes are negative.  However, due to her type 1  diabetes, recurrent chest pain, and previous negative stress  evaluations, it may be reasonable to proceed with cardiac  catheterization for further evaluation of her chest pain.   PLAN:  1. We will admit her to rule out myocardial infarction with serial      enzymes and EKGs.  2. Continue aspirin therapy and p.r.n. nitroglycerin.  She is not      hypertensive and therefore we will not start further medicines at      this time.  If she does become hypertensive, given her diabetes, an      ACE inhibitor would seem appropriate.  We will check a fasting      lipid profile.  3. We will consider possible heart catheterization on Monday, Jan 20, 2007.  This determination will be made later this morning following      further evaluation by [facility].      Lowella Bandy, MD  Electronically Signed     JJC/MEDQ  D:  01/18/2007  T:  01/18/2007  Job:  [id number]

## 2011-05-07 ENCOUNTER — Encounter: Payer: Self-pay | Admitting: Internal Medicine

## 2011-05-28 ENCOUNTER — Ambulatory Visit: Payer: Medicaid Other | Admitting: Internal Medicine

## 2011-06-15 ENCOUNTER — Ambulatory Visit (HOSPITAL_COMMUNITY)
Admission: RE | Admit: 2011-06-15 | Discharge: 2011-06-15 | Disposition: A | Discharge status: Home / Self Care | Payer: Medicaid Other | Source: Ambulatory Visit | Attending: Urology | Admitting: Urology

## 2011-06-15 ENCOUNTER — Other Ambulatory Visit (HOSPITAL_COMMUNITY): Payer: Self-pay | Admitting: Urology

## 2011-06-15 DIAGNOSIS — N200 Calculus of kidney: Secondary | ICD-10-CM

## 2011-06-15 DIAGNOSIS — R1031 Right lower quadrant pain: Secondary | ICD-10-CM | POA: Insufficient documentation

## 2011-06-15 DIAGNOSIS — Z87442 Personal history of urinary calculi: Secondary | ICD-10-CM | POA: Insufficient documentation

## 2011-06-15 DIAGNOSIS — R109 Unspecified abdominal pain: Secondary | ICD-10-CM

## 2011-06-21 ENCOUNTER — Encounter: Payer: Self-pay | Admitting: Internal Medicine

## 2011-06-21 ENCOUNTER — Ambulatory Visit (INDEPENDENT_AMBULATORY_CARE_PROVIDER_SITE_OTHER): Payer: Medicaid Other | Admitting: Internal Medicine

## 2011-06-21 DIAGNOSIS — I498 Other specified cardiac arrhythmias: Secondary | ICD-10-CM

## 2011-06-21 DIAGNOSIS — I471 Supraventricular tachycardia: Secondary | ICD-10-CM

## 2011-06-21 DIAGNOSIS — I2510 Atherosclerotic heart disease of native coronary artery without angina pectoris: Secondary | ICD-10-CM

## 2011-06-21 NOTE — Patient Instructions (Signed)
Your physician recommends that you schedule a follow-up appointment in: 3 months with Dr Diona Browner in [place] and 9 months with Dr Johney Frame in [place]

## 2011-06-24 ENCOUNTER — Encounter: Payer: Self-pay | Admitting: Internal Medicine

## 2011-06-24 NOTE — Assessment & Plan Note (Signed)
Stable No change required today  She will follow-up with Dr Diona Browner

## 2011-06-24 NOTE — Progress Notes (Signed)
The patient presents today for routine electrophysiology followup.  Since last being seen in our clinic, the patient reports doing reasonably well.   She continues to have occasional palpitations, though these are short lived.  Today, she denies symptoms of chest pain, shortness of breath, orthopnea, PND, lower extremity edema, dizziness, presyncope, syncope, or neurologic sequela.  The patient feels that she is tolerating medications without difficulties and is otherwise without complaint today.   Past Medical History  Diagnosis Date  . Diabetes mellitus   . Hypertension   . Seizures   . CAD (coronary artery disease)     DES to mid RCA 01/2007 and DES to mid RCA 01/2009; LVEF is normal  . Chronic pain syndrome   . Mononeuritis multiplex   . Atrial tachycardia     noninducible at EP study  . GERD (gastroesophageal reflux disease)   . Nephrolithiasis     bilateral  . Pulmonary nodules     bilateral  . Pneumonia   . Hyperlipidemia   . Anxiety   . Depression    Past Surgical History  Procedure Date  . Cholecystectomy   . Hysterectomy     total  . Right knee surgery   . Ep study     noninducible atrial tachycardia, no ablation performed    Current Outpatient Prescriptions  Medication Sig Dispense Refill  . ALPRAZolam (XANAX) 0.5 MG tablet Take 0.5 mg by mouth 2 (two) times daily.        Marland Kitchen aspirin 81 MG tablet Take 81 mg by mouth daily.        . citalopram (CELEXA) 20 MG tablet Take 20 mg by mouth daily.        . clopidogrel (PLAVIX) 75 MG tablet Take 75 mg by mouth daily.        Marland Kitchen diltiazem (CARDIZEM) 30 MG tablet Take 30 mg by mouth as needed.        . divalproex (DEPAKOTE) 500 MG 24 hr tablet Take 500 mg by mouth 2 (two) times daily.        Marland Kitchen gabapentin (NEURONTIN) 100 MG capsule Take 100 mg by mouth 2 (two) times daily.        . Hydrocodone-Acetaminophen (VICODIN PO) Take by mouth as needed.        . Insulin Aspart (NOVOLOG PENFILL [place]) Inject into the skin. Sliding scale         . insulin NPH-insulin regular (HUMULIN 70/30) (70-30) 100 UNIT/ML injection Inject into the skin. Inject 20 units subcutaneously each AM and 10 units each PM       . nitroGLYCERIN (NITROSTAT) 0.4 MG SL tablet Place 0.4 mg under the tongue every 5 (five) minutes as needed.        . ranitidine (ZANTAC) 300 MG capsule Take 300 mg by mouth every evening.        . simvastatin (ZOCOR) 10 MG tablet Take 10 mg by mouth at bedtime.          Allergies  Allergen Reactions  . Ampicillin     History   Social History  . Marital Status: Married    Spouse Name: N/A    Number of Children: 2  . Years of Education: N/A   Occupational History  . Disabled    Social History Main Topics  . Smoking status: Never Smoker   . Smokeless tobacco: Not on file  . Alcohol Use: No  . Drug Use: No  . Sexually Active: Not on file  Other Topics Concern  . Not on file   Social History Narrative  . No narrative on file    Family History  Problem Relation Age of Onset  . COPD Father   . Heart attack Mother    Physical Exam: Filed Vitals:   06/21/11 1530  BP: 145/69  Pulse: 55  Resp: 18  Height: 5\' 7"  (1.702 m)  Weight: 136 lb 1.9 oz (61.744 kg)    GEN- The patient is well appearing, alert and oriented x 3 today.   Head- normocephalic, atraumatic Eyes-  Sclera clear, conjunctiva pink Ears- hearing intact Oropharynx- clear Neck- supple, no JVP Lymph- no cervical lymphadenopathy Lungs- Clear to ausculation bilaterally, normal work of breathing Heart- Regular rate and rhythm, no murmurs, rubs or gallops, PMI not laterally displaced GI- soft, NT, ND, + BS Extremities- no clubbing, cyanosis, or edema MS- no significant deformity or atrophy Skin- no rash or lesion Psych- depressed mood, flat affect Neuro- strength and sensation are intact  ekg today reveals sinus bradycardia 55 bpm, otherwise normal ekg  Assessment and Plan:

## 2011-06-24 NOTE — Assessment & Plan Note (Signed)
Doing very well  No changes at this time as her palpitations are reasonably stable

## 2011-07-01 ENCOUNTER — Other Ambulatory Visit: Payer: Self-pay | Admitting: Internal Medicine

## 2011-07-02 LAB — CBC
HCT: 41.3
Hemoglobin: 13.9
MCHC: 33.7
MCV: 86.1
RBC: 4.79

## 2011-07-02 LAB — BASIC METABOLIC PANEL
CO2: 31
Chloride: 105
Creatinine, Ser: 0.74
GFR calc Af Amer: >60

## 2011-11-30 ENCOUNTER — Other Ambulatory Visit: Payer: Self-pay | Admitting: Physician Assistant

## 2011-12-19 ENCOUNTER — Encounter: Payer: Self-pay | Admitting: Internal Medicine

## 2011-12-19 ENCOUNTER — Other Ambulatory Visit: Payer: Self-pay

## 2011-12-20 DIAGNOSIS — R0602 Shortness of breath: Secondary | ICD-10-CM

## 2011-12-20 DIAGNOSIS — R079 Chest pain, unspecified: Secondary | ICD-10-CM

## 2011-12-21 ENCOUNTER — Encounter: Payer: Self-pay | Admitting: Internal Medicine

## 2011-12-24 ENCOUNTER — Encounter: Payer: Self-pay | Admitting: *Deleted

## 2011-12-24 ENCOUNTER — Other Ambulatory Visit: Payer: Self-pay | Admitting: Physician Assistant

## 2011-12-24 ENCOUNTER — Telehealth: Payer: Self-pay | Admitting: *Deleted

## 2011-12-24 ENCOUNTER — Encounter (HOSPITAL_COMMUNITY): Payer: Self-pay | Admitting: Pharmacy Technician

## 2011-12-24 NOTE — Telephone Encounter (Signed)
Per voice dictation email sent by Dr. Earnestine Leys, pt needs (L) heart cath following d/c from [facility] for chest pain and CAD. Cath scheduled in main lab (d/t Medicaid) for Wednesday, 12/26/2011 with Dr. Excell Seltzer at 0830 (pt to arrive at 0630).  Attempted to reach pt to notify of cath instruction, etc. Spoke with pt's husband who states pt is not home. He will notify pt to contact the office regarding cardiac cath.

## 2011-12-24 NOTE — Telephone Encounter (Signed)
Pt notified regarding cath arrival time and instructions (see "letter). Pt verbalized understanding. Pt confirmed medications and these were corrected by RN in Epic.

## 2011-12-26 ENCOUNTER — Ambulatory Visit (HOSPITAL_COMMUNITY)
Admission: RE | Admit: 2011-12-26 | Discharge: 2011-12-26 | Disposition: A | Discharge status: Home / Self Care | Payer: Medicaid Other | Source: Ambulatory Visit | Attending: Cardiovascular Disease | Admitting: Cardiovascular Disease

## 2011-12-26 ENCOUNTER — Encounter (HOSPITAL_COMMUNITY)
Admission: RE | Disposition: A | Discharge status: Home / Self Care | Payer: Self-pay | Source: Ambulatory Visit | Attending: Cardiovascular Disease

## 2011-12-26 DIAGNOSIS — R079 Chest pain, unspecified: Secondary | ICD-10-CM | POA: Insufficient documentation

## 2011-12-26 DIAGNOSIS — I2510 Atherosclerotic heart disease of native coronary artery without angina pectoris: Secondary | ICD-10-CM | POA: Insufficient documentation

## 2011-12-26 DIAGNOSIS — R6889 Other general symptoms and signs: Secondary | ICD-10-CM | POA: Insufficient documentation

## 2011-12-26 HISTORY — PX: LEFT HEART CATHETERIZATION WITH CORONARY ANGIOGRAM: SHX5451

## 2011-12-26 LAB — GLUCOSE, CAPILLARY
Glucose-Capillary: 248 mg/dL — ABNORMAL HIGH (ref 70–99)
Glucose-Capillary: 264 mg/dL — ABNORMAL HIGH (ref 70–99)

## 2011-12-26 SURGERY — LEFT HEART CATHETERIZATION WITH CORONARY ANGIOGRAM
Anesthesia: LOCAL

## 2011-12-26 MED ORDER — LIDOCAINE HCL (PF) 1 % IJ SOLN
INTRAMUSCULAR | Status: AC
  Filled 2011-12-26: qty 30

## 2011-12-26 MED ORDER — SODIUM CHLORIDE 0.9 % IJ SOLN: 3.0000 mL | Freq: Two times a day (BID) | INTRAMUSCULAR | Status: DC

## 2011-12-26 MED ORDER — ACETAMINOPHEN 325 MG PO TABS
ORAL_TABLET | ORAL | Status: AC
  Filled 2011-12-26: qty 2

## 2011-12-26 MED ORDER — ASPIRIN 81 MG PO CHEW
324.0000 mg | CHEWABLE_TABLET | ORAL | Status: AC
  Administered 2011-12-26: 324 mg via ORAL
  Filled 2011-12-26: qty 4

## 2011-12-26 MED ORDER — SODIUM CHLORIDE 0.9 % IJ SOLN: 3.0000 mL | INTRAMUSCULAR | Status: DC | PRN

## 2011-12-26 MED ORDER — SODIUM CHLORIDE 0.9 % IV SOLN: 250.0000 mL | INTRAVENOUS | Status: DC | PRN

## 2011-12-26 MED ORDER — INSULIN ASPART 100 UNIT/ML ~~LOC~~ SOLN: 6.0000 [IU] | Freq: Once | SUBCUTANEOUS | Status: DC

## 2011-12-26 MED ORDER — HEPARIN SODIUM (PORCINE) 1000 UNIT/ML IJ SOLN
INTRAMUSCULAR | Status: AC
  Filled 2011-12-26: qty 1

## 2011-12-26 MED ORDER — HEPARIN (PORCINE) IN NACL 2-0.9 UNIT/ML-% IJ SOLN
INTRAMUSCULAR | Status: AC
  Filled 2011-12-26: qty 2000

## 2011-12-26 MED ORDER — ACETAMINOPHEN 325 MG PO TABS
650.0000 mg | ORAL_TABLET | ORAL | Status: DC | PRN
  Administered 2011-12-26: 650 mg via ORAL

## 2011-12-26 MED ORDER — MIDAZOLAM HCL 2 MG/2ML IJ SOLN
INTRAMUSCULAR | Status: AC
  Filled 2011-12-26: qty 2

## 2011-12-26 MED ORDER — DIAZEPAM 5 MG PO TABS
5.0000 mg | ORAL_TABLET | ORAL | Status: AC
  Administered 2011-12-26: 5 mg via ORAL
  Filled 2011-12-26: qty 1

## 2011-12-26 MED ORDER — NITROGLYCERIN 0.2 MG/ML ON CALL CATH LAB
INTRAVENOUS | Status: AC
  Filled 2011-12-26: qty 1

## 2011-12-26 MED ORDER — SODIUM CHLORIDE 0.9 % IV SOLN
INTRAVENOUS | Status: DC
  Administered 2011-12-26: 1000 mL via INTRAVENOUS

## 2011-12-26 MED ORDER — FENTANYL CITRATE 0.05 MG/ML IJ SOLN
INTRAMUSCULAR | Status: AC
  Filled 2011-12-26: qty 2

## 2011-12-26 NOTE — CV Procedure (Signed)
   Cardiac Catheterization Procedure Note  Name: SAMEERAH NACHTIGAL MRN: [id number] DOB: Sep 22, 1960  Procedure: Catheter placement, Selective Coronary Angiography  Indication: Chest pain with known CAD   Procedural Details: The right wrist was prepped, draped, and anesthetized with 1% lidocaine. Using the modified Seldinger technique, a 5 French sheath was introduced into the right radial artery. 2.5 mg Nicardipine was administered through the sheath, weight-based unfractionated heparin was administered intravenously. Standard Judkins catheters were used for selective coronary angiography. Catheter exchanges were performed over an exchange length guidewire. There were no immediate procedural complications. A TR band was used for radial hemostasis at the completion of the procedure.  The patient was transferred to the post catheterization recovery area for further monitoring.  Procedural Findings: Hemodynamics: AO 106/52  Coronary angiography: Coronary dominance: right  Left mainstem: The left main stem is widely patent. There is no significant angiographic disease.  Left anterior descending (LAD): The LAD is patent to the apex of the heart. There is no significant obstructive disease throughout. The first diagonal is patent with no significant disease  Left circumflex (LCx): The circumflex is patent. There are 3 obtuse marginal branches with no significant obstructive disease. The vessel is smooth throughout its course with no significant stenosis.  Right coronary artery (RCA): The RCA is a moderate caliber, dominant vessel. There are stents at the junction of the mid and distal vessel, widely patent without significant in-stent restenosis. The remaining portions of the right coronary artery are smooth. There was mild vasospasm off of the distal tip of the catheter in the proximal RCA. This appeared nonobstructive. The PDA is patent. The posterolateral branches are very small. There are no  significant stenoses throughout the course of the right coronary artery.  Final Conclusions:   1. Widely patent stents in the right coronary artery 2. No significant obstructive coronary artery disease  Recommendations: Continued medical therapy.  Tonny Bollman 12/26/2011, 9:37 AM

## 2011-12-26 NOTE — H&P (Signed)
This is a 51 year old woman presenting for outpatient cardiac catheterization. There is a full H&P documented 12/19/2011 by Dr. Diona Browner. I have reviewed this in detail with no additions to make. The patient has a history of coronary artery disease. She has recently had exertional angina. She was hospitalized with tachycardia palpitations and ruled out for MI. Please see the scanned note for further details. I have reviewed the risks, indications, and alternatives to cardiac catheterization and possible PCI with the patient. She understands and agrees to proceed.  Tonny Bollman 12/26/2011 9:08 AM

## 2011-12-26 NOTE — Discharge Instructions (Signed)
Radial Site Care Refer to this sheet in the next few weeks. These instructions provide you with information on caring for yourself after your procedure. Your caregiver may also give you more specific instructions. Your treatment has been planned according to current medical practices, but problems sometimes occur. Call your caregiver if you have any problems or questions after your procedure. HOME CARE INSTRUCTIONS  You may shower the day after the procedure.Remove the bandage (dressing) and gently wash the site with plain soap and water.Gently pat the site dry.   Do not apply powder or lotion to the site.   Do not submerge the affected site in water for 3 to 5 days.   Inspect the site at least twice daily.   Do not flex or bend the affected arm for 24 hours.   No lifting over 5 pounds (2.3 kg) for 5 days after your procedure.   Do not drive home if you are discharged the same day of the procedure. Have someone else drive you.   You may drive 24 hours after the procedure unless otherwise instructed by your caregiver.   Do not operate machinery or power tools for 24 hours.   A responsible adult should be with you for the first 24 hours after you arrive home.  What to expect:  Any bruising will usually fade within 1 to 2 weeks.   Blood that collects in the tissue (hematoma) may be painful to the touch. It should usually decrease in size and tenderness within 1 to 2 weeks.  SEEK IMMEDIATE MEDICAL CARE IF:  You have unusual pain at the radial site.   You have redness, warmth, swelling, or pain at the radial site.   You have drainage (other than a small amount of blood on the dressing).   You have chills.   You have a fever or persistent symptoms for more than 72 hours.   You have a fever and your symptoms suddenly get worse.   Your arm becomes pale, cool, tingly, or numb.   You have heavy bleeding from the site. Hold pressure on the site.  Document Released: 10/06/2010  Document Revised: 08/23/2011 Document Reviewed: 10/06/2010 Exit[employer] Patient Information 2012 [employer]. 

## 2011-12-27 MED FILL — Nicardipine HCl IV Soln 2.5 MG/ML: INTRAVENOUS | Qty: 1 | Status: AC

## 2011-12-31 ENCOUNTER — Telehealth: Payer: Self-pay | Admitting: *Deleted

## 2011-12-31 NOTE — Telephone Encounter (Signed)
Pt called stating Dr. Excell Seltzer was supposed to talk to Dr. Earnestine Leys and then let her know how they planned to proceed. Pt does not have post-cath follow up. Pt 's cath showed widely patent stents in the RCA and no significant obstructive coronary artery disease.  Dr. Diona Browner will be pt's primary cardiologist per [facility] d/c. Reviewed w/him who states pt needs routine post-cath f/u scheduled. Pt notified and verbalized understanding.

## 2012-01-21 ENCOUNTER — Other Ambulatory Visit: Payer: Self-pay | Admitting: Physician Assistant

## 2012-01-28 ENCOUNTER — Ambulatory Visit (INDEPENDENT_AMBULATORY_CARE_PROVIDER_SITE_OTHER): Payer: Medicaid Other | Admitting: Physician Assistant

## 2012-01-28 ENCOUNTER — Encounter: Payer: Self-pay | Admitting: Physician Assistant

## 2012-01-28 VITALS — BP 109/67 | HR 64 | Ht 67.0 in | Wt 143.0 lb

## 2012-01-28 DIAGNOSIS — R001 Bradycardia, unspecified: Secondary | ICD-10-CM

## 2012-01-28 DIAGNOSIS — E782 Mixed hyperlipidemia: Secondary | ICD-10-CM

## 2012-01-28 DIAGNOSIS — R0789 Other chest pain: Secondary | ICD-10-CM

## 2012-01-28 DIAGNOSIS — R002 Palpitations: Secondary | ICD-10-CM

## 2012-01-28 DIAGNOSIS — I498 Other specified cardiac arrhythmias: Secondary | ICD-10-CM

## 2012-01-28 NOTE — Patient Instructions (Signed)
   21 day cardiac monitor  Decrease caffeine Your physician recommends that you go to the [facility] lab for blood work for fasting lipid and liver panel.  Reminder:  Nothing to eat or drink after 12 midnight prior to labs. Follow up in 1 month

## 2012-01-28 NOTE — Assessment & Plan Note (Signed)
Improved on Pindolol, but still occurring several times a week. Will evaluate further with a 21 day cardiac monitor, and arrange f/u with me for review of monitor results and further recommendations. Recent TSH normal. Continue current dose pindolol.

## 2012-01-28 NOTE — Progress Notes (Signed)
HPI: Patient presents for post catheterization followup, following recent consultation here at [facility] for CP. She ruled out for MI, had a 2-D echo: EF 60-65%, no WMAs, and a negative, non-diagnostic GXT, also negative for chronotropic incompetence. Patient presented with tachycardia palpitations and CP. Dr Andee Lineman placed her on Pindolol, given noted bradycardia. Arrangements were made for outpatient elective catheterization.  Cardiac catheterization yielded widely patent RCA stents, otherwise nonobstructive CAD; LVG deferred.  Patient reports significant decrease in palpitations, now occurring only 2-3x week. As we recently noted, she continues to have CP, but only following the palps. These still bother her, lasting as long as 20 minutes in duration.    Allergies  Allergen Reactions  . Ampicillin Other (See Comments)    Black out    Current Outpatient Prescriptions  Medication Sig Dispense Refill  . aspirin 81 MG tablet Take 81 mg by mouth daily.        . citalopram (CELEXA) 20 MG tablet Take 20 mg by mouth daily.        . clopidogrel (PLAVIX) 75 MG tablet Take 75 mg by mouth daily.        . cyclobenzaprine (FLEXERIL) 10 MG tablet Take 10 mg by mouth at bedtime.      . diclofenac sodium (VOLTAREN) 1 % GEL Apply 1 application topically 3 (three) times daily.      Marland Kitchen gabapentin (NEURONTIN) 300 MG capsule Take 300 mg by mouth 2 (two) times daily.      . Insulin Aspart (NOVOLOG PENFILL [place]) Inject into the skin 3 (three) times daily before meals. Sliding scale      . insulin NPH-insulin regular (HUMULIN 70/30) (70-30) 100 UNIT/ML injection Inject 10-20 Units into the skin 2 (two) times daily with a meal. Inject 20 units subcutaneously each AM and 10 units each PM      . nitroGLYCERIN (NITROSTAT) 0.4 MG SL tablet Place 0.4 mg under the tongue every 5 (five) minutes as needed. For chest pain      . oxyCODONE-acetaminophen (PERCOCET) 7.5-325 MG per tablet Take 1 tablet by mouth every 8 (eight)  hours as needed.      . pindolol (VISKEN) 5 MG tablet TAKE ONE TABLET BY MOUTH TWICE DAILY  60 tablet  1  . ranitidine (ZANTAC) 300 MG capsule Take 300 mg by mouth every evening.        . simvastatin (ZOCOR) 10 MG tablet Take 10 mg by mouth at bedtime.          Past Medical History  Diagnosis Date  . Diabetes mellitus   . Hypertension   . Seizures   . CAD (coronary artery disease)     DES to mid RCA 01/2007 and DES to mid RCA 01/2009; LVEF is normal  . Chronic pain syndrome   . Mononeuritis multiplex   . Atrial tachycardia     noninducible at EP study  . GERD (gastroesophageal reflux disease)   . Nephrolithiasis     bilateral  . Pulmonary nodules     bilateral  . Pneumonia   . Hyperlipidemia   . Anxiety   . Depression     Past Surgical History  Procedure Date  . Cholecystectomy   . Hysterectomy     total  . Right knee surgery   . Ep study     noninducible atrial tachycardia, no ablation performed    History   Social History  . Marital Status: Married    Spouse Name: N/A    Number  of Children: 2  . Years of Education: N/A   Occupational History  . Disabled    Social History Main Topics  . Smoking status: Never Smoker   . Smokeless tobacco: Never Used  . Alcohol Use: No  . Drug Use: No  . Sexually Active: Not on file   Other Topics Concern  . Not on file   Social History Narrative  . No narrative on file    Family History  Problem Relation Age of Onset  . COPD Father   . Heart attack Mother     ROS: no nausea, vomiting; no fever, chills; no melena, hematochezia; no claudication  PHYSICAL EXAM: BP 109/67  Pulse 64  Ht 5\' 7"  (1.702 m)  Wt 143 lb (64.864 kg)  BMI 22.40 kg/m2 GENERAL: 51 year old female; NAD HEENT: NCAT, PERRLA, EOMI; sclera clear; no xanthelasma NECK: palpable bilateral carotid pulses, no bruits; no JVD; no TM LUNGS: CTA bilaterally CARDIAC: RRR (S1, S2); no significant murmurs; no rubs or gallops ABDOMEN: soft, non-tender;  intact BS EXTREMETIES: R wrist stable, intact RP; no hematoma; no significant peripheral edema SKIN: warm/dry; no obvious rash/lesions MUSCULOSKELETAL: no joint deformity NEURO: no focal deficit; NL affect   EKG:    ASSESSMENT & PLAN:  No problem-specific assessment & plan notes found for this encounter.   Gene Shima Compere, PAC

## 2012-01-28 NOTE — Assessment & Plan Note (Addendum)
No further workup indicated. Recent cath demonstrated widely patent RCA stents and o/w nonobstructive CAD. Symptoms assoc. only with palps, which are improved following recent addition of Pindolol. Continue current medication regimen.

## 2012-01-28 NOTE — Assessment & Plan Note (Signed)
Documented during recent hospitalization, and as low as mid 40 bpm range in early morning. Negative GXT for chronotropic incompetence. Currently stable on pindolol. Will evaluate further with 21 day cardiac monitor.

## 2012-02-01 DIAGNOSIS — R002 Palpitations: Secondary | ICD-10-CM

## 2012-02-04 ENCOUNTER — Telehealth: Payer: Self-pay | Admitting: *Deleted

## 2012-02-04 NOTE — Telephone Encounter (Signed)
Patient informed and copy of labs sent to [facility] office.

## 2012-02-04 NOTE — Telephone Encounter (Signed)
Message copied by Eustace Moore on Mon Feb 04, 2012  9:59 AM ------      Message from: Murriel Hopper      Created: Fri Feb 01, 2012  5:23 PM                   ----- Message -----         From: Prescott Parma, PA         Sent: 02/01/2012   3:45 PM           To: Lesle Chris, LPN            Elevated LFTs. Please forward to primary MD for review, and further recommendations.

## 2012-03-03 ENCOUNTER — Ambulatory Visit: Payer: Medicaid Other | Admitting: Physician Assistant

## 2012-03-07 ENCOUNTER — Ambulatory Visit: Payer: Medicaid Other | Admitting: Physician Assistant

## 2012-03-19 ENCOUNTER — Encounter: Payer: Self-pay | Admitting: Physician Assistant

## 2012-03-19 ENCOUNTER — Ambulatory Visit (INDEPENDENT_AMBULATORY_CARE_PROVIDER_SITE_OTHER): Payer: Medicaid Other | Admitting: Physician Assistant

## 2012-03-19 VITALS — BP 105/68 | HR 60 | Ht 67.0 in | Wt 144.4 lb

## 2012-03-19 DIAGNOSIS — I2510 Atherosclerotic heart disease of native coronary artery without angina pectoris: Secondary | ICD-10-CM

## 2012-03-19 DIAGNOSIS — E785 Hyperlipidemia, unspecified: Secondary | ICD-10-CM

## 2012-03-19 MED ORDER — PINDOLOL 5 MG PO TABS: 5.0000 mg | ORAL_TABLET | Freq: Two times a day (BID) | ORAL | Status: DC

## 2012-03-19 NOTE — Progress Notes (Signed)
Primary Cardiologist: Lewayne Bunting, MD   HPI: Patient presents for early scheduled followup, and review of 21 day event monitor. This was for further evaluation of palpitations, much improved following addition of pindolol. Monitor yielded NSR with frequent PACs, and no definite atrial fibrillation/flutter, and no ventricular ectopy.  Clinically, she reports today that she is doing "really well". In fact, she felt no associated palpitations, during cardiac monitoring. She also denies chest pain.  Patient is currently undergoing a formal GI evaluation, by Dr. Teena Dunk, for abdominal pain. I also noted recent elevated alkaline phosphatase 137, with otherwise normal LFTs. LDL was 74.  Allergies  Allergen Reactions  . Ampicillin Other (See Comments)    Black out    Current Outpatient Prescriptions  Medication Sig Dispense Refill  . aspirin 81 MG tablet Take 81 mg by mouth daily.        . citalopram (CELEXA) 20 MG tablet Take 20 mg by mouth daily.        . clopidogrel (PLAVIX) 75 MG tablet Take 75 mg by mouth daily.        . diclofenac sodium (VOLTAREN) 1 % GEL Apply 1 application topically 3 (three) times daily.      Marland Kitchen gabapentin (NEURONTIN) 300 MG capsule Take 300 mg by mouth 2 (two) times daily.      . Insulin Aspart (NOVOLOG PENFILL [place]) Inject into the skin 3 (three) times daily before meals. Sliding scale      . insulin NPH-insulin regular (HUMULIN 70/30) (70-30) 100 UNIT/ML injection Inject 10-20 Units into the skin 2 (two) times daily with a meal. Inject 20 units subcutaneously each AM and 10 units each PM      . methocarbamol (ROBAXIN) 500 MG tablet Take 500 mg by mouth daily.       . nitroGLYCERIN (NITROSTAT) 0.4 MG SL tablet Place 0.4 mg under the tongue every 5 (five) minutes as needed. For chest pain      . pindolol (VISKEN) 5 MG tablet Take 1 tablet (5 mg total) by mouth 2 (two) times daily.  60 tablet  6  . simvastatin (ZOCOR) 10 MG tablet Take 10 mg by mouth at bedtime.        Marland Kitchen  DISCONTD: pindolol (VISKEN) 5 MG tablet TAKE ONE TABLET BY MOUTH TWICE DAILY  60 tablet  1    Past Medical History  Diagnosis Date  . Diabetes mellitus   . Hypertension   . Seizures   . CAD (coronary artery disease)     DES to mid RCA 01/2007 and DES to mid RCA 01/2009; LVEF is normal  . Chronic pain syndrome   . Mononeuritis multiplex   . Atrial tachycardia     noninducible at EP study  . GERD (gastroesophageal reflux disease)   . Nephrolithiasis     bilateral  . Pulmonary nodules     bilateral  . Pneumonia   . Hyperlipidemia   . Anxiety   . Depression     Past Surgical History  Procedure Date  . Cholecystectomy   . Hysterectomy     total  . Right knee surgery   . Ep study     noninducible atrial tachycardia, no ablation performed    History   Social History  . Marital Status: Married    Spouse Name: N/A    Number of Children: 2  . Years of Education: N/A   Occupational History  . Disabled    Social History Main Topics  . Smoking status:  Never Smoker   . Smokeless tobacco: Never Used  . Alcohol Use: No  . Drug Use: No  . Sexually Active: Not on file   Other Topics Concern  . Not on file   Social History Narrative  . No narrative on file    Family History  Problem Relation Age of Onset  . COPD Father   . Heart attack Mother     ROS: no nausea, vomiting; no fever, chills; no melena, hematochezia; no claudication  PHYSICAL EXAM: BP 105/68  Pulse 60  Ht 5\' 7"  (1.702 m)  Wt 144 lb 6.4 oz (65.499 kg)  BMI 22.62 kg/m2 GENERAL: 51 year old female; NAD  HEENT: NCAT, PERRLA, EOMI; sclera clear; no xanthelasma  NECK: palpable bilateral carotid pulses, no bruits; no JVD; no TM  LUNGS: CTA bilaterally  CARDIAC: RRR (S1, S2); no significant murmurs; no rubs or gallops  ABDOMEN: soft, non-tender; intact BS  EXTREMETIES: R wrist stable, intact RP; no hematoma; no significant peripheral edema  SKIN: warm/dry; no obvious rash/lesions  MUSCULOSKELETAL: no  joint deformity  NEURO: no focal deficit; NL affect    EKG:    ASSESSMENT & PLAN:  Palpitations No further workup indicated. Quiescent on current dose of pindolol, with recent event monitor notable for frequent PACs, but negative for dysrhythmias.  CAD, NATIVE VESSEL Quiescent on current medication regimen. Recent catheterization yielded widely patent RCA stents, otherwise nonobstructive CAD.  DYSLIPIDEMIA Well-controlled with recent LDL 74, on low-dose simvastatin.    Gene Meegan Shanafelt, PAC

## 2012-03-19 NOTE — Assessment & Plan Note (Signed)
Well-controlled with recent LDL 74, on low-dose simvastatin.

## 2012-03-19 NOTE — Assessment & Plan Note (Signed)
No further workup indicated. Quiescent on current dose of pindolol, with recent event monitor notable for frequent PACs, but negative for dysrhythmias.

## 2012-03-19 NOTE — Patient Instructions (Addendum)
Your physician recommends that you schedule a follow-up appointment in: 6 months with Dr. Degent. You will receive a reminder letter in the mail in about 4 months reminding you to call and schedule your appointment. If you don't receive this letter, please contact our office.   Your physician recommends that you continue on your current medications as directed. Please refer to the Current Medication list given to you today.  

## 2012-03-19 NOTE — Assessment & Plan Note (Signed)
Quiescent on current medication regimen. Recent catheterization yielded widely patent RCA stents, otherwise nonobstructive CAD.

## 2012-08-22 ENCOUNTER — Encounter: Payer: Self-pay | Admitting: *Deleted

## 2012-08-22 DIAGNOSIS — Z950 Presence of cardiac pacemaker: Secondary | ICD-10-CM | POA: Insufficient documentation

## 2012-08-28 ENCOUNTER — Encounter: Payer: Self-pay | Admitting: Internal Medicine

## 2012-08-28 ENCOUNTER — Ambulatory Visit (INDEPENDENT_AMBULATORY_CARE_PROVIDER_SITE_OTHER): Payer: Medicaid Other | Admitting: Internal Medicine

## 2012-08-28 VITALS — BP 116/72 | HR 55 | Ht 66.0 in | Wt 143.8 lb

## 2012-08-28 DIAGNOSIS — I2510 Atherosclerotic heart disease of native coronary artery without angina pectoris: Secondary | ICD-10-CM

## 2012-08-28 DIAGNOSIS — I471 Supraventricular tachycardia, unspecified: Secondary | ICD-10-CM

## 2012-08-28 NOTE — Patient Instructions (Signed)
Your physician wants you to follow-up in: 6 months with Dr Diona Browner with [facility] and 12 months with Dr Jacquiline Doe will receive a reminder letter in the mail two months in advance. If you don't receive a letter, please call our office to schedule the follow-up appointment.

## 2012-08-28 NOTE — Progress Notes (Signed)
PCP: Ignatius Specking., MD   Primary Cardiologist:  Dr Ignatius Specking is a 51 y.o. female who presents today for routine electrophysiology followup.  Since last being seen in our clinic, the patient reports doing very well.  Today, she denies symptoms of palpitations, chest pain, shortness of breath,  lower extremity edema, dizziness, presyncope, or syncope.  The patient is otherwise without complaint today.   Past Medical History  Diagnosis Date  . Diabetes mellitus   . Hypertension   . Seizures   . CAD (coronary artery disease)     DES to mid RCA 01/2007 and DES to mid RCA 01/2009; LVEF is normal  . Chronic pain syndrome   . Mononeuritis multiplex   . Atrial tachycardia     noninducible at EP study  . GERD (gastroesophageal reflux disease)   . Nephrolithiasis     bilateral  . Pulmonary nodules     bilateral  . Pneumonia   . Hyperlipidemia   . Anxiety   . Depression    Past Surgical History  Procedure Date  . Cholecystectomy   . Hysterectomy     total  . Right knee surgery   . Ep study     noninducible atrial tachycardia, no ablation performed    Current Outpatient Prescriptions  Medication Sig Dispense Refill  . aspirin 81 MG tablet Take 81 mg by mouth daily.        . citalopram (CELEXA) 20 MG tablet Take 20 mg by mouth daily.        . clopidogrel (PLAVIX) 75 MG tablet Take 75 mg by mouth daily.        Marland Kitchen gabapentin (NEURONTIN) 300 MG capsule Take 300 mg by mouth 2 (two) times daily.      . Insulin Aspart (NOVOLOG PENFILL [place]) Inject into the skin 3 (three) times daily before meals. Sliding scale      . insulin NPH-insulin regular (HUMULIN 70/30) (70-30) 100 UNIT/ML injection Inject 10-20 Units into the skin 2 (two) times daily with a meal. Inject 20 units subcutaneously each AM and 10 units each PM      . nitroGLYCERIN (NITROSTAT) 0.4 MG SL tablet Place 0.4 mg under the tongue every 5 (five) minutes as needed. For chest pain      . pindolol (VISKEN) 5 MG tablet Take  1 tablet (5 mg total) by mouth 2 (two) times daily.  60 tablet  6  . simvastatin (ZOCOR) 10 MG tablet Take 10 mg by mouth at bedtime.          Physical Exam: Filed Vitals:   08/28/12 1142  BP: 116/72  Pulse: 55  Height: 5\' 6"  (1.676 m)  Weight: 143 lb 12.8 oz (65.227 kg)    GEN- The patient is well appearing, alert and oriented x 3 today.   Head- normocephalic, atraumatic Eyes-  Sclera clear, conjunctiva pink Ears- hearing intact Oropharynx- clear Lungs- Clear to ausculation bilaterally, normal work of breathing Heart- Regular rate and rhythm, no murmurs, rubs or gallops, PMI not laterally displaced GI- soft, NT, ND, + BS Extremities- no clubbing, cyanosis, or edema  ekg today reveals sinus rhythm 56 bpm, normal ekg  Assessment and Plan:  SVT/ PSVT/ PAT  Doing very well  No changes at this time as her palpitations are well controlled  CAD, NATIVE VESSEL  Stable  No change required today  She will follow-up with Dr Diona Browner in 6 months I will see in a year

## 2012-12-08 ENCOUNTER — Other Ambulatory Visit: Payer: Self-pay | Admitting: Physician Assistant

## 2012-12-09 ENCOUNTER — Encounter: Payer: Self-pay | Admitting: Physician Assistant

## 2012-12-10 ENCOUNTER — Encounter: Payer: Self-pay | Admitting: Physician Assistant

## 2012-12-10 DIAGNOSIS — R079 Chest pain, unspecified: Secondary | ICD-10-CM

## 2012-12-24 ENCOUNTER — Encounter: Payer: Medicaid Other | Admitting: Physician Assistant

## 2012-12-26 ENCOUNTER — Other Ambulatory Visit: Payer: Self-pay | Admitting: *Deleted

## 2012-12-26 MED ORDER — NITROGLYCERIN 0.4 MG SL SUBL: 0.4000 mg | SUBLINGUAL_TABLET | SUBLINGUAL | Status: DC | PRN

## 2012-12-26 NOTE — Telephone Encounter (Signed)
Fax Received. Refill Completed. Haley [name] (R.M.A)   

## 2013-01-01 ENCOUNTER — Encounter: Payer: Self-pay | Admitting: Cardiology

## 2013-01-01 DIAGNOSIS — R079 Chest pain, unspecified: Secondary | ICD-10-CM

## 2013-01-05 ENCOUNTER — Encounter: Payer: Self-pay | Admitting: Physician Assistant

## 2013-01-05 ENCOUNTER — Telehealth: Payer: Self-pay | Admitting: Physician Assistant

## 2013-01-05 ENCOUNTER — Encounter: Payer: Self-pay | Admitting: *Deleted

## 2013-01-05 ENCOUNTER — Ambulatory Visit (INDEPENDENT_AMBULATORY_CARE_PROVIDER_SITE_OTHER): Payer: Medicaid Other | Admitting: Physician Assistant

## 2013-01-05 VITALS — BP 108/68 | HR 60 | Ht 67.0 in | Wt 135.8 lb

## 2013-01-05 DIAGNOSIS — I2510 Atherosclerotic heart disease of native coronary artery without angina pectoris: Secondary | ICD-10-CM

## 2013-01-05 NOTE — Patient Instructions (Signed)
   Left heart catherization - see info sheet given Continue all current medications. Follow up given after procedure

## 2013-01-05 NOTE — Telephone Encounter (Signed)
Left heart cath - main lab (MEDICAID) - Thursday, 4/24 - Nishan - 12:30  * Follow up after cath

## 2013-01-05 NOTE — Progress Notes (Signed)
Primary Cardiologist: Sam McDowell, MD   HPI: Post hospital followup from [facility], following presentation with chest pain. Serial cardiac markers NL. Further outpatient evaluation was recommended with an exercise stress echocardiogram, given increased specificity. Patient had had prior false positive GXT Cardiolite in 2010, followed by cardiac catheterization yielding patent RCA stents.   - Dobutamine stress echocardiogram, April 17: Nondiagnostic study secondary to uninterpretable echocardiographic images (65% MPHR); frequent PVCs noted; 1-2 millimeter ST segment depression in leads 2, 3, aVF, V5, and V6, with no associated CP; normal LVF (EF 60-65%), with no WMAs at rest  She returns today reporting recurrent CP since recent hospitalization, including having to use NTG on 2 occasions. She reported relief after approximately 15-20 minutes. Although she has had some CP at rest, she typically has this with moderate exertion. In general, this has been ongoing for the last month, and is reminiscent of her symptoms prior to undergoing PCI in the past.  Allergies  Allergen Reactions  . Ampicillin Other (See Comments)    Black out    Current Outpatient Prescriptions  Medication Sig Dispense Refill  . aspirin 81 MG tablet Take 81 mg by mouth daily.        . citalopram (CELEXA) 20 MG tablet Take 20 mg by mouth daily.        . clopidogrel (PLAVIX) 75 MG tablet Take 75 mg by mouth daily.        . gabapentin (NEURONTIN) 300 MG capsule Take 300 mg by mouth 3 (three) times daily. Take 1 capsule in AM, take 1 capsule at Noon, and take 2 capsules at bedtime.      . Insulin Aspart (NOVOLOG PENFILL [place]) Inject into the skin 3 (three) times daily before meals. Sliding scale      . insulin NPH-insulin regular (HUMULIN 70/30) (70-30) 100 UNIT/ML injection Inject 5-25 Units into the skin 2 (two) times daily with a meal. Inject 25 units subcutaneously each AM and 6 units each PM      . nitroGLYCERIN (NITROSTAT) 0.4 MG  SL tablet Place 1 tablet (0.4 mg total) under the tongue every 5 (five) minutes as needed. For chest pain  25 tablet  5  . pindolol (VISKEN) 5 MG tablet TAKE ONE TABLET BY MOUTH TWICE DAILY  60 tablet  0  . simvastatin (ZOCOR) 10 MG tablet Take 10 mg by mouth at bedtime.         No current facility-administered medications for this visit.    Past Medical History  Diagnosis Date  . Diabetes mellitus   . Hypertension   . Seizures   . CAD (coronary artery disease)     DES to mid RCA 01/2007 and DES to mid RCA 01/2009; LVEF is normal  . Chronic pain syndrome   . Mononeuritis multiplex   . Atrial tachycardia     noninducible at EP study  . GERD (gastroesophageal reflux disease)   . Nephrolithiasis     bilateral  . Pulmonary nodules     bilateral  . Pneumonia   . Hyperlipidemia   . Anxiety   . Depression     Past Surgical History  Procedure Laterality Date  . Cholecystectomy    . Hysterectomy      total  . Right knee surgery    . Ep study      noninducible atrial tachycardia, no ablation performed    History   Social History  . Marital Status: Married    Spouse Name: N/A      Number of Children: 2  . Years of Education: N/A   Occupational History  . Disabled    Social History Main Topics  . Smoking status: Never Smoker   . Smokeless tobacco: Never Used  . Alcohol Use: No  . Drug Use: No  . Sexually Active: Not on file   Other Topics Concern  . Not on file   Social History Narrative  . No narrative on file    Family History  Problem Relation Age of Onset  . COPD Father   . Heart attack Mother     ROS: no nausea, vomiting; no fever, chills; no melena, hematochezia; no claudication  PHYSICAL EXAM: BP 108/68  Pulse 60  Ht 5' 7" (1.702 m)  Wt 135 lb 12.8 oz (61.598 kg)  BMI 21.26 kg/m2 GENERAL: 52-year-old female; NAD HEENT: NCAT, PERRLA, EOMI; sclera clear; no xanthelasma NECK: palpable bilateral carotid pulses, no bruits; no JVD; no TM LUNGS: CTA  bilaterally CARDIAC: RRR (S1, S2); no significant murmurs; no rubs or gallops ABDOMEN: soft, non-tender; intact BS EXTREMETIES: no significant peripheral edema SKIN: warm/dry; no obvious rash/lesions MUSCULOSKELETAL: no joint deformity NEURO: no focal deficit; flat affect   EKG:   ASSESSMENT & PLAN:  CAD, NATIVE VESSEL I reviewed the results of the recent suboptimal dobutamine stress echocardiogram with the patient, as well as afterwards with Dr. McDowell. It was noted that although the study was suboptimal (65% MPHR), there were ST segment changes in the inferior leads, as well as possible anteroseptal HK at peak heart rate. Although there was no documented CP, the patient did have significant dyspnea. Rather than proceed with a Lexiscan stress Cardiolite for reassessment, recommendation is to proceed with diagnostic coronary angiography for definitive exclusion of significant CAD, including possible ISR. Her last catheterization in April 2013 yielded patent RCA stents. The patient is agreeable with this plan, and the risks/benefits were discussed. Dr. McDowell approved this plan, as well.    Gene Kasiyah Platter, PAC  

## 2013-01-05 NOTE — Assessment & Plan Note (Signed)
I reviewed the results of the recent suboptimal dobutamine stress echocardiogram with the patient, as well as afterwards with Dr. Diona Browner. It was noted that although the study was suboptimal (65% MPHR), there were ST segment changes in the inferior leads, as well as possible anteroseptal HK at peak heart rate. Although there was no documented CP, the patient did have significant dyspnea. Rather than proceed with a Lexiscan stress Cardiolite for reassessment, recommendation is to proceed with diagnostic coronary angiography for definitive exclusion of significant CAD, including possible ISR. Her last catheterization in April 2013 yielded patent RCA stents. The patient is agreeable with this plan, and the risks/benefits were discussed. Dr. Diona Browner approved this plan, as well.

## 2013-01-05 NOTE — Telephone Encounter (Signed)
Pt has Medicaid only.  No precert required. °

## 2013-01-06 ENCOUNTER — Other Ambulatory Visit: Payer: Self-pay | Admitting: Physician Assistant

## 2013-01-06 DIAGNOSIS — R079 Chest pain, unspecified: Secondary | ICD-10-CM

## 2013-01-08 ENCOUNTER — Ambulatory Visit (HOSPITAL_COMMUNITY)
Admission: RE | Admit: 2013-01-08 | Discharge: 2013-01-08 | Disposition: A | Discharge status: Home / Self Care | Payer: Medicaid Other | Source: Ambulatory Visit | Attending: Cardiovascular Disease | Admitting: Cardiovascular Disease

## 2013-01-08 ENCOUNTER — Encounter (HOSPITAL_COMMUNITY)
Admission: RE | Disposition: A | Discharge status: Home / Self Care | Payer: Self-pay | Source: Ambulatory Visit | Attending: Cardiovascular Disease

## 2013-01-08 DIAGNOSIS — R0989 Other specified symptoms and signs involving the circulatory and respiratory systems: Secondary | ICD-10-CM | POA: Insufficient documentation

## 2013-01-08 DIAGNOSIS — R079 Chest pain, unspecified: Secondary | ICD-10-CM | POA: Insufficient documentation

## 2013-01-08 DIAGNOSIS — Z9861 Coronary angioplasty status: Secondary | ICD-10-CM | POA: Insufficient documentation

## 2013-01-08 DIAGNOSIS — I10 Essential (primary) hypertension: Secondary | ICD-10-CM | POA: Insufficient documentation

## 2013-01-08 DIAGNOSIS — E119 Type 2 diabetes mellitus without complications: Secondary | ICD-10-CM | POA: Insufficient documentation

## 2013-01-08 DIAGNOSIS — R0609 Other forms of dyspnea: Secondary | ICD-10-CM | POA: Insufficient documentation

## 2013-01-08 DIAGNOSIS — I2510 Atherosclerotic heart disease of native coronary artery without angina pectoris: Secondary | ICD-10-CM | POA: Insufficient documentation

## 2013-01-08 HISTORY — PX: LEFT HEART CATHETERIZATION WITH CORONARY ANGIOGRAM: SHX5451

## 2013-01-08 LAB — BASIC METABOLIC PANEL
CO2: 29 mEq/L (ref 19–32)
Calcium: 10 mg/dL (ref 8.4–10.5)
Creatinine, Ser: 0.98 mg/dL (ref 0.50–1.10)
Glucose, Bld: 93 mg/dL (ref 70–99)

## 2013-01-08 LAB — CBC
HCT: 38.9 % (ref 36.0–46.0)
MCH: 29.4 pg (ref 26.0–34.0)
MCV: 85.3 fL (ref 78.0–100.0)
Platelets: 202 10*3/uL (ref 150–400)
RDW: 13.1 % (ref 11.5–15.5)

## 2013-01-08 LAB — GLUCOSE, CAPILLARY
Glucose-Capillary: 90 mg/dL (ref 70–99)
Glucose-Capillary: 90 mg/dL (ref 70–99)

## 2013-01-08 SURGERY — LEFT HEART CATHETERIZATION WITH CORONARY ANGIOGRAM
Anesthesia: LOCAL

## 2013-01-08 MED ORDER — LIDOCAINE HCL (PF) 1 % IJ SOLN
INTRAMUSCULAR | Status: AC
  Filled 2013-01-08: qty 30

## 2013-01-08 MED ORDER — SODIUM CHLORIDE 0.9 % IV SOLN: 250.0000 mL | INTRAVENOUS | Status: DC | PRN

## 2013-01-08 MED ORDER — MIDAZOLAM HCL 2 MG/2ML IJ SOLN
INTRAMUSCULAR | Status: AC
  Filled 2013-01-08: qty 2

## 2013-01-08 MED ORDER — ASPIRIN EC 325 MG PO TBEC: 325.0000 mg | DELAYED_RELEASE_TABLET | Freq: Every day | ORAL | Status: DC

## 2013-01-08 MED ORDER — ASPIRIN 81 MG PO CHEW
CHEWABLE_TABLET | ORAL | Status: AC
  Filled 2013-01-08: qty 4

## 2013-01-08 MED ORDER — OXYCODONE-ACETAMINOPHEN 5-325 MG PO TABS: 1.0000 | ORAL_TABLET | ORAL | Status: DC | PRN

## 2013-01-08 MED ORDER — POTASSIUM CHLORIDE CRYS ER 20 MEQ PO TBCR
EXTENDED_RELEASE_TABLET | ORAL | Status: AC
  Filled 2013-01-08: qty 2

## 2013-01-08 MED ORDER — POTASSIUM CHLORIDE CRYS ER 20 MEQ PO TBCR
40.0000 meq | EXTENDED_RELEASE_TABLET | Freq: Once | ORAL | Status: AC
  Administered 2013-01-08: 40 meq via ORAL

## 2013-01-08 MED ORDER — SODIUM CHLORIDE 0.9 % IV SOLN
INTRAVENOUS | Status: DC
  Administered 2013-01-08: 07:00:00 via INTRAVENOUS

## 2013-01-08 MED ORDER — SODIUM CHLORIDE 0.9 % IJ SOLN: 3.0000 mL | Freq: Two times a day (BID) | INTRAMUSCULAR | Status: DC

## 2013-01-08 MED ORDER — SODIUM CHLORIDE 0.45 % IV SOLN: INTRAVENOUS | Status: AC

## 2013-01-08 MED ORDER — SODIUM CHLORIDE 0.9 % IJ SOLN: 3.0000 mL | INTRAMUSCULAR | Status: DC | PRN

## 2013-01-08 MED ORDER — HEPARIN (PORCINE) IN NACL 2-0.9 UNIT/ML-% IJ SOLN
INTRAMUSCULAR | Status: AC
  Filled 2013-01-08: qty 1000

## 2013-01-08 MED ORDER — ACETAMINOPHEN 325 MG PO TABS: 650.0000 mg | ORAL_TABLET | ORAL | Status: DC | PRN

## 2013-01-08 MED ORDER — DIAZEPAM 2 MG PO TABS: 2.0000 mg | ORAL_TABLET | ORAL | Status: DC | PRN

## 2013-01-08 MED ORDER — ASPIRIN 81 MG PO CHEW
324.0000 mg | CHEWABLE_TABLET | ORAL | Status: AC
  Administered 2013-01-08: 324 mg via ORAL

## 2013-01-08 MED ORDER — ONDANSETRON HCL 4 MG/2ML IJ SOLN: 4.0000 mg | Freq: Four times a day (QID) | INTRAMUSCULAR | Status: DC | PRN

## 2013-01-08 NOTE — Interval H&P Note (Signed)
History and Physical Interval Note:  01/08/2013 7:22 AM  Haley [name]  has presented today for surgery, with the diagnosis of Chest pain  The various methods of treatment have been discussed with the patient and family. After consideration of risks, benefits and other options for treatment, the patient has consented to  Procedure(s): LEFT HEART CATHETERIZATION WITH CORONARY ANGIOGRAM (N/A) as a surgical intervention .  The patient's history has been reviewed, patient examined, no change in status, stable for surgery.  I have reviewed the patient's chart and labs.  Questions were answered to the patient's satisfaction.     Charlton Haws

## 2013-01-08 NOTE — CV Procedure (Signed)
      Catheterization   Indication: Chest pain  Procedure: After informed consent and clinical "time out" the right groin was prepped and draped in a sterile fashion.  A 5Fr sheath was placed in the right femoral artery using seldinger technique and local lidocaine.  Standard JL4, JR4 and angled pigtail catheters were used to engage the coronary arteries.  Coronary arteries were visualized in orthogonal views using caudal and cranial angulation.  RAO ventriculography was done using 24* cc of contrast.    Medications:   Versed: 2 mg's  Fentanyl: 0 ug's  Coronary Arteries: Right dominant with no anomalies  LM: Normal  LAD: Normal    IM: Normal  D1: Normal  D2 Normal  Circumflex: Normal   OM1: Normal  OM2: Normal  RCA: Normal distal stents widely patent   PDA: small and normal  PLA: normal  Ventriculography: EF: 60 %,  No RWMA;s  Hemodynamics:  Aortic Pressure: 156 68 mmHg  LV Pressure: 167 11 18 mmHg  Impression:  Normal arteries with patent RCA stents Continue medical Rx  Charlton Haws 01/08/2013 7:45 AM

## 2013-01-08 NOTE — H&P (View-Only) (Signed)
Primary Cardiologist: Simona Huh, MD   HPI: Post hospital followup from [facility], following presentation with chest pain. Serial cardiac markers NL. Further outpatient evaluation was recommended with an exercise stress echocardiogram, given increased specificity. Patient had had prior false positive GXT Cardiolite in 2010, followed by cardiac catheterization yielding patent RCA stents.   - Dobutamine stress echocardiogram, April 17: Nondiagnostic study secondary to uninterpretable echocardiographic images (65% MPHR); frequent PVCs noted; 1-2 millimeter ST segment depression in leads 2, 3, aVF, V5, and V6, with no associated CP; normal LVF (EF 60-65%), with no WMAs at rest  She returns today reporting recurrent CP since recent hospitalization, including having to use NTG on 2 occasions. She reported relief after approximately 15-20 minutes. Although she has had some CP at rest, she typically has this with moderate exertion. In general, this has been ongoing for the last month, and is reminiscent of her symptoms prior to undergoing PCI in the past.  Allergies  Allergen Reactions  . Ampicillin Other (See Comments)    Black out    Current Outpatient Prescriptions  Medication Sig Dispense Refill  . aspirin 81 MG tablet Take 81 mg by mouth daily.        . citalopram (CELEXA) 20 MG tablet Take 20 mg by mouth daily.        . clopidogrel (PLAVIX) 75 MG tablet Take 75 mg by mouth daily.        Marland Kitchen gabapentin (NEURONTIN) 300 MG capsule Take 300 mg by mouth 3 (three) times daily. Take 1 capsule in AM, take 1 capsule at [facility], and take 2 capsules at bedtime.      . Insulin Aspart (NOVOLOG PENFILL [place]) Inject into the skin 3 (three) times daily before meals. Sliding scale      . insulin NPH-insulin regular (HUMULIN 70/30) (70-30) 100 UNIT/ML injection Inject 5-25 Units into the skin 2 (two) times daily with a meal. Inject 25 units subcutaneously each AM and 6 units each PM      . nitroGLYCERIN (NITROSTAT) 0.4 MG  SL tablet Place 1 tablet (0.4 mg total) under the tongue every 5 (five) minutes as needed. For chest pain  25 tablet  5  . pindolol (VISKEN) 5 MG tablet TAKE ONE TABLET BY MOUTH TWICE DAILY  60 tablet  0  . simvastatin (ZOCOR) 10 MG tablet Take 10 mg by mouth at bedtime.         No current facility-administered medications for this visit.    Past Medical History  Diagnosis Date  . Diabetes mellitus   . Hypertension   . Seizures   . CAD (coronary artery disease)     DES to mid RCA 01/2007 and DES to mid RCA 01/2009; LVEF is normal  . Chronic pain syndrome   . Mononeuritis multiplex   . Atrial tachycardia     noninducible at EP study  . GERD (gastroesophageal reflux disease)   . Nephrolithiasis     bilateral  . Pulmonary nodules     bilateral  . Pneumonia   . Hyperlipidemia   . Anxiety   . Depression     Past Surgical History  Procedure Laterality Date  . Cholecystectomy    . Hysterectomy      total  . Right knee surgery    . Ep study      noninducible atrial tachycardia, no ablation performed    History   Social History  . Marital Status: Married    Spouse Name: N/A  Number of Children: 2  . Years of Education: N/A   Occupational History  . Disabled    Social History Main Topics  . Smoking status: Never Smoker   . Smokeless tobacco: Never Used  . Alcohol Use: No  . Drug Use: No  . Sexually Active: Not on file   Other Topics Concern  . Not on file   Social History Narrative  . No narrative on file    Family History  Problem Relation Age of Onset  . COPD Father   . Heart attack Mother     ROS: no nausea, vomiting; no fever, chills; no melena, hematochezia; no claudication  PHYSICAL EXAM: BP 108/68  Pulse 60  Ht 5\' 7"  (1.702 m)  Wt 135 lb 12.8 oz (61.598 kg)  BMI 21.26 kg/m2 GENERAL: 52 year old female; NAD HEENT: NCAT, PERRLA, EOMI; sclera clear; no xanthelasma NECK: palpable bilateral carotid pulses, no bruits; no JVD; no TM LUNGS: CTA  bilaterally CARDIAC: RRR (S1, S2); no significant murmurs; no rubs or gallops ABDOMEN: soft, non-tender; intact BS EXTREMETIES: no significant peripheral edema SKIN: warm/dry; no obvious rash/lesions MUSCULOSKELETAL: no joint deformity NEURO: no focal deficit; flat affect   EKG:   ASSESSMENT & PLAN:  CAD, NATIVE VESSEL I reviewed the results of the recent suboptimal dobutamine stress echocardiogram with the patient, as well as afterwards with Dr. Diona Browner. It was noted that although the study was suboptimal (65% MPHR), there were ST segment changes in the inferior leads, as well as possible anteroseptal HK at peak heart rate. Although there was no documented CP, the patient did have significant dyspnea. Rather than proceed with a Lexiscan stress Cardiolite for reassessment, recommendation is to proceed with diagnostic coronary angiography for definitive exclusion of significant CAD, including possible ISR. Her last catheterization in April 2013 yielded patent RCA stents. The patient is agreeable with this plan, and the risks/benefits were discussed. Dr. Diona Browner approved this plan, as well.    Gene Viola Kinnick, PAC

## 2013-01-12 ENCOUNTER — Other Ambulatory Visit: Payer: Self-pay | Admitting: Physician Assistant

## 2013-01-23 ENCOUNTER — Encounter: Payer: Medicaid Other | Admitting: Physician Assistant

## 2013-01-23 NOTE — Progress Notes (Signed)
Primary Cardiologist: Simona Huh, MD   HPI: Post elective cardiac catheterization followup, for further evaluation of ongoing CP and recent nondiagnostic dobutamine stress echocardiogram, secondary to suboptimal HR (65% MPHR). Although she did not experience CP during the stress test, she had significant SOB and evidence of 1-2 mm ST segment depression in the inferior leads.   - Cardiac catheterization, April 24: Normal coronary arteries with patent RCA stents; EF 60 %, No RWMAs   Allergies  Allergen Reactions  . Ampicillin Other (See Comments)    Black out    Current Outpatient Prescriptions  Medication Sig Dispense Refill  . aspirin EC 81 MG tablet Take 81 mg by mouth daily.      . citalopram (CELEXA) 20 MG tablet Take 20 mg by mouth daily.        . clopidogrel (PLAVIX) 75 MG tablet Take 75 mg by mouth daily.        Marland Kitchen gabapentin (NEURONTIN) 300 MG capsule Take 300-600 mg by mouth 3 (three) times daily. Take 1 capsule in AM, take 1 capsule at [facility], and take 2 capsules at bedtime.      . insulin aspart (NOVOLOG FLEXPEN) 100 UNIT/ML injection Inject 6-8 Units into the skin 3 (three) times daily with meals. Sliding scale :cbg 200, gives 4 units. cbg 250 6 units      . insulin NPH-insulin regular (HUMULIN 70/30) (70-30) 100 UNIT/ML injection Inject 6-25 Units into the skin 2 (two) times daily with a meal. Inject 25 units subcutaneously each AM and 6 units each PM      . nitroGLYCERIN (NITROSTAT) 0.4 MG SL tablet Place 1 tablet (0.4 mg total) under the tongue every 5 (five) minutes as needed. For chest pain  25 tablet  5  . pindolol (VISKEN) 5 MG tablet Take 1 tablet (5 mg total) by mouth 2 (two) times daily.  60 tablet  3  . simvastatin (ZOCOR) 10 MG tablet Take 10 mg by mouth at bedtime.         No current facility-administered medications for this visit.    Past Medical History  Diagnosis Date  . Diabetes mellitus   . Hypertension   . Seizures   . CAD (coronary artery disease)    DES to mid RCA 01/2007 and DES to mid RCA 01/2009; LVEF is normal  . Chronic pain syndrome   . Mononeuritis multiplex   . Atrial tachycardia     noninducible at EP study  . GERD (gastroesophageal reflux disease)   . Nephrolithiasis     bilateral  . Pulmonary nodules     bilateral  . Pneumonia   . Hyperlipidemia   . Anxiety   . Depression     Past Surgical History  Procedure Laterality Date  . Cholecystectomy    . Hysterectomy      total  . Right knee surgery    . Ep study      noninducible atrial tachycardia, no ablation performed    History   Social History  . Marital Status: Married    Spouse Name: N/A    Number of Children: 2  . Years of Education: N/A   Occupational History  . Disabled    Social History Main Topics  . Smoking status: Never Smoker   . Smokeless tobacco: Never Used  . Alcohol Use: No  . Drug Use: No  . Sexually Active: Not on file   Other Topics Concern  . Not on file   Social History Narrative  .  No narrative on file    Family History  Problem Relation Age of Onset  . COPD Father   . Heart attack Mother     ROS: no nausea, vomiting; no fever, chills; no melena, hematochezia; no claudication  PHYSICAL EXAM: There were no vitals taken for this visit. GENERAL:  52 year old female; NAD HEENT: NCAT, PERRLA, EOMI; sclera clear; no xanthelasma NECK: palpable bilateral carotid pulses, no bruits; no JVD; no TM LUNGS: CTA bilaterally CARDIAC: RRR (S1, S2); no significant murmurs; no rubs or gallops ABDOMEN: soft, non-tender; intact BS EXTREMETIES: intact distal pulses; no significant peripheral edema SKIN: warm/dry; no obvious rash/lesions MUSCULOSKELETAL: no joint deformity NEURO: no focal deficit; NL affect   EKG: reviewed and available in Electronic Records   ASSESSMENT & PLAN:  No problem-specific assessment & plan notes found for this encounter.   Gene Roselia Snipe, PAC

## 2013-06-08 ENCOUNTER — Other Ambulatory Visit: Payer: Self-pay | Admitting: Cardiology

## 2013-06-08 MED ORDER — PINDOLOL 5 MG PO TABS: 5.0000 mg | ORAL_TABLET | Freq: Two times a day (BID) | ORAL | Status: DC

## 2013-06-25 ENCOUNTER — Telehealth: Payer: Self-pay | Admitting: Cardiology

## 2013-06-25 NOTE — Telephone Encounter (Signed)
Mrs. Seigler called stating that she is seeing Dr. Donata Clay in [facility] office # [phone] Dr. Melvia Heaps needs to know if patient can come off of Plavix for (7) days to have a procedure On her arm.

## 2013-06-25 NOTE — Telephone Encounter (Signed)
Called patient and instructed patient to have the provider's office that is doing the procedure to contact us directly about this request.

## 2013-07-21 ENCOUNTER — Encounter: Payer: Self-pay | Admitting: Cardiology

## 2013-07-21 ENCOUNTER — Ambulatory Visit (INDEPENDENT_AMBULATORY_CARE_PROVIDER_SITE_OTHER): Payer: Medicaid Other | Admitting: Cardiology

## 2013-07-21 VITALS — BP 109/64 | HR 71 | Ht 67.0 in | Wt 123.0 lb

## 2013-07-21 DIAGNOSIS — E785 Hyperlipidemia, unspecified: Secondary | ICD-10-CM

## 2013-07-21 DIAGNOSIS — I471 Supraventricular tachycardia: Secondary | ICD-10-CM

## 2013-07-21 DIAGNOSIS — I2510 Atherosclerotic heart disease of native coronary artery without angina pectoris: Secondary | ICD-10-CM

## 2013-07-21 NOTE — Progress Notes (Signed)
Clinical Summary Haley [name] is a 52 y.o.female last seen in the office by Haley [name] in April. From a cardiac perspective she states that she has been doing fairly well. No progressive angina or increasing nitroglycerin requirement.  Cardiac catheterization done in April of this year demonstrated normal coronary arteries with patent RCA stents and LVEF 60%.  Lab work from August showed BUN 19, creatinine 0.9, potassium 3.5, hemoglobin 14.1, platelets 226.  She states that she has had interval surgery on her left hand, pending left shoulder surgery in December at [facility]. She is following with Haley [name], office # [phone]. It is requested that the patient come off Plavix for 7 days prior to her procedure.   Allergies  Allergen Reactions  . Ampicillin Other (See Comments)    Black out    Current Outpatient Prescriptions  Medication Sig Dispense Refill  . ALPRAZolam (XANAX) 0.5 MG tablet Take 0.5 mg by mouth 2 (two) times daily.      Marland Kitchen amitriptyline (ELAVIL) 10 MG tablet Take 10 mg by mouth at bedtime.      Marland Kitchen aspirin EC 81 MG tablet Take 81 mg by mouth daily.      . citalopram (CELEXA) 20 MG tablet Take 20 mg by mouth daily.        . clopidogrel (PLAVIX) 75 MG tablet Take 75 mg by mouth daily.        Marland Kitchen docusate sodium (COLACE) 100 MG capsule Take 100 mg by mouth 2 (two) times daily as needed for mild constipation.      . fentaNYL (DURAGESIC - DOSED MCG/HR) 50 MCG/HR Place 50 mcg onto the skin every 3 (three) days.      Marland Kitchen gabapentin (NEURONTIN) 300 MG capsule Take 300-600 mg by mouth 3 (three) times daily. Take 1 capsule in AM, take 1 capsule at [facility], and take 2 capsules at bedtime.      . insulin aspart (NOVOLOG FLEXPEN) 100 UNIT/ML injection Inject 6-8 Units into the skin 3 (three) times daily with meals. Sliding scale :cbg 200, gives 4 units. cbg 250 6 units      . insulin NPH-insulin regular (HUMULIN 70/30) (70-30) 100 UNIT/ML injection Inject 4 Units into the skin  daily with supper. Inject 25 units subcutaneously each AM and 6 units each PM      . lidocaine (XYLOCAINE) 5 % ointment Apply 1 application topically 2 (two) times daily.      . nitroGLYCERIN (NITROSTAT) 0.4 MG SL tablet Place 1 tablet (0.4 mg total) under the tongue every 5 (five) minutes as needed. For chest pain  25 tablet  5  . pindolol (VISKEN) 5 MG tablet Take 1 tablet (5 mg total) by mouth 2 (two) times daily.  60 tablet  3  . simvastatin (ZOCOR) 10 MG tablet Take 10 mg by mouth at bedtime.         No current facility-administered medications for this visit.    Past Medical History  Diagnosis Date  . Type 2 diabetes mellitus   . Essential hypertension, benign   . Seizures   . Coronary atherosclerosis of native coronary artery     DES to mid RCA 01/2007 and DES to mid RCA 01/2009; LVEF normal  . Chronic pain syndrome   . Mononeuritis multiplex   . Atrial tachycardia     Noninducible at EP study  . GERD (gastroesophageal reflux disease)   . Nephrolithiasis   . Pulmonary nodules   . Pneumonia   .  Hyperlipidemia   . Anxiety   . Depression     Social History Haley [name] reports that she has never smoked. She has never used smokeless tobacco. Haley [name] reports that she does not drink alcohol.  Review of Systems No palpitations or syncope. No reported bleeding episodes on DAPT. Limited motion of left arm and hand. Otherwise negative.  Physical Examination Filed Vitals:   07/21/13 1012  BP: 109/64  Pulse: 71   Filed Weights   07/21/13 1012  Weight: 123 lb (55.792 kg)   Chronically ill-appearing woman, no acute distress. HEENT: Conjunctiva and lids normal, oropharynx clear with poor dentition. Neck: Supple, no elevated JVP or carotid bruits, no thyromegaly. Lungs: Clear to auscultation, nonlabored breathing at rest. Cardiac: Regular rate and rhythm, no S3 or significant systolic murmur, no pericardial rub. Abdomen: Soft, nontender, bowel sounds present. Extremities: No  pitting edema, distal pulses 2+. Skin: Warm and dry. Musculoskeletal: No kyphosis. Neuropsychiatric: Alert and oriented x3, affect grossly appropriate.   Problem List and Plan   CAD, NATIVE VESSEL Symptomatically stable. Cardiac catheterization in April of this year demonstrated no significant obstructive CAD with patent RCA stent sites and normal LVEF. From the perspective of pending left shoulder surgery at [facility], patient should be able to hold Plavix for 7 days as requested. Last DES intervention was in 2010.  DYSLIPIDEMIA She continues on Zocor, followed by Vyas.  SVT/ PSVT/ PAT History of atrial tachycardia, noninducible EP study. Continue observation on pindolol.    Haley [name], M.D., F.A.C.C.

## 2013-07-21 NOTE — Assessment & Plan Note (Signed)
Symptomatically stable. Cardiac catheterization in April of this year demonstrated no significant obstructive CAD with patent RCA stent sites and normal LVEF. From the perspective of pending left shoulder surgery at [facility], patient should be able to hold Plavix for 7 days as requested. Last DES intervention was in 2010.

## 2013-07-21 NOTE — Assessment & Plan Note (Signed)
History of atrial tachycardia, noninducible EP study. Continue observation on pindolol. 

## 2013-07-21 NOTE — Assessment & Plan Note (Signed)
She continues on Zocor, followed by [employer].

## 2013-07-21 NOTE — Patient Instructions (Signed)
Continue all current medications. Your physician wants you to follow up in: 6 months.  You will receive a reminder letter in the mail one-two months in advance.  If you don't receive a letter, please call our office to schedule the follow up appointment   

## 2013-08-20 ENCOUNTER — Ambulatory Visit: Payer: Medicaid Other | Admitting: Cardiology

## 2013-09-25 ENCOUNTER — Telehealth: Payer: Self-pay | Admitting: Cardiology

## 2013-09-25 MED ORDER — PINDOLOL 5 MG PO TABS: 5.0000 mg | ORAL_TABLET | Freq: Two times a day (BID) | ORAL | Status: DC

## 2013-09-25 NOTE — Telephone Encounter (Signed)
Received fax refill request  Rx # [id number] Medication:  Pindolol 5 mg tab Qty 60 Sig:  Take one tablet by mouth twice daily Physician:  Diona BrownerMcDowell  **Prior Authorization Required / call [phone]/[id number]**

## 2013-09-25 NOTE — Telephone Encounter (Signed)
Pt called saying that Dr. Donata ClayLeonardo Katural was needing her to hold plavix again for another spinal tap. I informed pt to contact Dr. Donata ClayLeonardo Katural office and have them fax us the information. Pt verbalized understanding.

## 2013-09-28 NOTE — Telephone Encounter (Signed)
[facility] so that MD could address.

## 2013-09-29 NOTE — Telephone Encounter (Signed)
Patient informed that she can hold plavix for 7 days for requested procedure to be [facility].

## 2014-01-26 ENCOUNTER — Encounter: Payer: Self-pay | Admitting: Cardiology

## 2014-01-26 ENCOUNTER — Ambulatory Visit (INDEPENDENT_AMBULATORY_CARE_PROVIDER_SITE_OTHER): Payer: Medicaid Other | Admitting: Cardiology

## 2014-01-26 VITALS — BP 104/64 | HR 66 | Ht 67.0 in | Wt 136.4 lb

## 2014-01-26 DIAGNOSIS — I471 Supraventricular tachycardia: Secondary | ICD-10-CM

## 2014-01-26 DIAGNOSIS — R002 Palpitations: Secondary | ICD-10-CM

## 2014-01-26 DIAGNOSIS — I2510 Atherosclerotic heart disease of native coronary artery without angina pectoris: Secondary | ICD-10-CM

## 2014-01-26 DIAGNOSIS — E785 Hyperlipidemia, unspecified: Secondary | ICD-10-CM

## 2014-01-26 MED ORDER — PINDOLOL 5 MG PO TABS: 5.0000 mg | ORAL_TABLET | Freq: Two times a day (BID) | ORAL | Status: DC

## 2014-01-26 NOTE — Patient Instructions (Signed)

## 2014-01-26 NOTE — Progress Notes (Signed)
Clinical Summary Haley [name] is a 53 y.o.female last seen in November 2014. She reports no clear cut angina symptoms or palpitations on medical therapy. Needs a refill for pindolol. She has had no bleeding problems on DAPT.  ECG today shows normal sinus rhythm.  Cardiac catheterization done in April 2014 demonstrated normal coronary arteries with patent RCA stents and LVEF 60%.   [facility] to [facility] for [facility] and hand - just had an MRI, may need additional surgeries.  She does tell me that she has been having problems with dysphagia, both liquids and solids, dull discomfort in her central sternal area when she swallows, feels like things are getting stuck.   Allergies  Allergen Reactions  . Ampicillin Other (See Comments)    Black out    Current Outpatient Prescriptions  Medication Sig Dispense Refill  . aspirin EC 81 MG tablet Take 81 mg by mouth daily.      . citalopram (CELEXA) 20 MG tablet Take 20 mg by mouth daily.        . clonazePAM (KLONOPIN) 0.5 MG tablet Take 0.5 mg by mouth 3 (three) times daily as needed for anxiety.      . clopidogrel (PLAVIX) 75 MG tablet Take 75 mg by mouth daily.        . fentaNYL (DURAGESIC - DOSED MCG/HR) 50 MCG/HR Place 50 mcg onto the skin every 3 (three) days.      Marland Kitchen. gabapentin (NEURONTIN) 300 MG capsule Take 300-600 mg by mouth 3 (three) times daily. Take 1 capsule in AM, [facility], and take 2 capsules at bedtime.      . insulin aspart (NOVOLOG FLEXPEN) 100 UNIT/ML injection Inject 4-8 Units into the skin 3 (three) times daily with meals. Sliding scale :cbg 200, gives 4 units. cbg 250 6 units      . insulin NPH Human (HUMULIN N,NOVOLIN N) 100 UNIT/ML injection Inject 4 Units into the skin. With dinner      . insulin NPH-regular Human (NOVOLIN 70/30) (70-30) 100 UNIT/ML injection Inject 12 Units into the skin daily with breakfast.      . nitroGLYCERIN (NITROSTAT) 0.4 MG SL tablet Place 1 tablet (0.4 mg total)  under the tongue every 5 (five) minutes as needed. For chest pain  25 tablet  5  . pindolol (VISKEN) 5 MG tablet Take 1 tablet (5 mg total) by mouth 2 (two) times daily.  60 tablet  6  . ranitidine (ZANTAC) 300 MG tablet Take 300 mg by mouth at bedtime.      . simvastatin (ZOCOR) 40 MG tablet Take 40 mg by mouth every evening.       No current facility-administered medications for this visit.    Past Medical History  Diagnosis Date  . Type 2 diabetes mellitus   . Essential hypertension, benign   . Seizures   . Coronary atherosclerosis of native coronary artery     DES to mid RCA 01/2007 and DES to mid RCA 01/2009; LVEF normal  . Chronic pain syndrome   . Mononeuritis multiplex   . Atrial tachycardia     Noninducible at EP study  . GERD (gastroesophageal reflux disease)   . Nephrolithiasis   . Pulmonary nodules   . Pneumonia   . Hyperlipidemia   . Anxiety   . Depression     Social History Haley [name] reports that she has never smoked. She has never used smokeless tobacco. Haley [name] reports that  she does not drink alcohol.  Review of Systems Negative except as outlined above.  Physical Examination Filed Vitals:   01/26/14 1006  BP: 104/64  Pulse: 66   Filed Weights   01/26/14 1006  Weight: 136 lb 6.4 oz (61.871 kg)    Chronically ill-appearing woman, no acute distress.  HEENT: Conjunctiva and lids normal, oropharynx clear with poor dentition.  Neck: Supple, no elevated JVP or carotid bruits, no thyromegaly.  Lungs: Clear to auscultation, nonlabored breathing at rest.  Cardiac: Regular rate and rhythm, no S3 or significant systolic murmur, no pericardial rub.  Abdomen: Soft, nontender, bowel sounds present.  Extremities: No pitting edema, distal pulses 2+.  Skin: Warm and dry.  Musculoskeletal: No kyphosis.  Neuropsychiatric: Alert and oriented x3, affect grossly appropriate.   Problem List and Plan   CAD, NATIVE VESSEL Symptomatically stable on medical  therapy. Cardiac catheterization in April of last year demonstrated no significant obstructive CAD with patent RCA stent sites and normal LVEF. ECG reviewed. Continue observation.   SVT/ PSVT/ PAT History of atrial tachycardia, noninducible EP study. Continue observation on pindolol.  DYSLIPIDEMIA She continues on Zocor, followed by Dr. Sherril CroonVyas.    Jonelle SidleSamuel G. Kaylee Trivett, M.D., F.A.C.C.

## 2014-01-26 NOTE — Assessment & Plan Note (Signed)
She continues on Zocor, followed by Dr. Sherril CroonVyas.

## 2014-01-26 NOTE — Assessment & Plan Note (Signed)
History of atrial tachycardia, noninducible EP study. Continue observation on pindolol.

## 2014-01-26 NOTE — Assessment & Plan Note (Signed)
Symptomatically stable on medical therapy. Cardiac catheterization in April of last year demonstrated no significant obstructive CAD with patent RCA stent sites and normal LVEF. ECG reviewed. Continue observation.

## 2014-08-26 ENCOUNTER — Encounter (HOSPITAL_COMMUNITY): Payer: Self-pay | Admitting: Cardiovascular Disease

## 2014-11-05 ENCOUNTER — Ambulatory Visit: Payer: Medicaid Other | Admitting: Cardiology

## 2014-12-01 ENCOUNTER — Ambulatory Visit (INDEPENDENT_AMBULATORY_CARE_PROVIDER_SITE_OTHER): Payer: Medicaid Other | Admitting: Cardiology

## 2014-12-01 ENCOUNTER — Encounter: Payer: Self-pay | Admitting: Cardiology

## 2014-12-01 ENCOUNTER — Encounter: Payer: Self-pay | Admitting: *Deleted

## 2014-12-01 VITALS — BP 95/62 | HR 58 | Ht 67.0 in | Wt 127.8 lb

## 2014-12-01 DIAGNOSIS — I2510 Atherosclerotic heart disease of native coronary artery without angina pectoris: Secondary | ICD-10-CM

## 2014-12-01 DIAGNOSIS — R0602 Shortness of breath: Secondary | ICD-10-CM

## 2014-12-01 DIAGNOSIS — E785 Hyperlipidemia, unspecified: Secondary | ICD-10-CM

## 2014-12-01 DIAGNOSIS — E1159 Type 2 diabetes mellitus with other circulatory complications: Secondary | ICD-10-CM

## 2014-12-01 DIAGNOSIS — I10 Essential (primary) hypertension: Secondary | ICD-10-CM

## 2014-12-01 NOTE — Patient Instructions (Signed)

## 2014-12-01 NOTE — Progress Notes (Signed)
Cardiology Office Note  Date: 12/01/2014   ID: Haley Seaeresa A Collett, DOB 08/25/1961, MRN [id number]  PCP: Ignatius SpeckingVYAS,DHRUV B., MD  Primary Cardiologist: Nona DellSamuel Ellwyn Ergle, MD   Chief Complaint  Patient presents with  . Coronary Artery Disease  . Hypertension    History of Present Illness: Haley [name] is a 54 y.o. female last seen in May 2015. She presents today for a routine visit. We reviewed her medications which have been stable from a cardiac perspective. She tells me that over the last few weeks she has been experiencing more significant shortness of breath with activities such as sweeping the floor in her house. She has not had any chest discomfort with this, but does get diaphoretic more so than normal. She denies any interval palpitations, dizziness, or syncope.  Last cardiac catheterization is detailed below, patent stent sites noted in the RCA as of 2013. She has not had interval ischemic testing.  She reports having lab work with Dr. Sherril CroonVyas fairly recently, reported good hemoglobin A1c, we will request the results.   Past Medical History  Diagnosis Date  . Type 2 diabetes mellitus   . Essential hypertension, benign   . Seizures   . Coronary atherosclerosis of native coronary artery     DES to mid RCA 01/2007 and DES to mid RCA 01/2009; LVEF normal  . Chronic pain syndrome   . Mononeuritis multiplex   . Atrial tachycardia     Noninducible at EP study  . GERD (gastroesophageal reflux disease)   . Nephrolithiasis   . Pulmonary nodules   . Pneumonia   . Hyperlipidemia   . Anxiety   . Depression     Past Surgical History  Procedure Laterality Date  . Cholecystectomy    . Abdominal hysterectomy      total  . Right knee surgery    . Left heart catheterization with coronary angiogram N/A 12/26/2011    Procedure: LEFT HEART CATHETERIZATION WITH CORONARY ANGIOGRAM;  Surgeon: Tonny BollmanMichael Cooper, MD;  [facility];  Service: Cardiovascular;  Laterality: N/A;  . Left heart  catheterization with coronary angiogram N/A 01/08/2013    Procedure: LEFT HEART CATHETERIZATION WITH CORONARY ANGIOGRAM;  Surgeon: Wendall StadePeter C Nishan, MD;  [facility];  Service: Cardiovascular;  Laterality: N/A;    Current Outpatient Prescriptions  Medication Sig Dispense Refill  . aspirin EC 81 MG tablet Take 81 mg by mouth daily.    . citalopram (CELEXA) 40 MG tablet Take 40 mg by mouth daily.    . clonazePAM (KLONOPIN) 0.5 MG tablet Take 0.5 mg by mouth 3 (three) times daily as needed for anxiety.    . clopidogrel (PLAVIX) 75 MG tablet Take 75 mg by mouth daily.      . fentaNYL (DURAGESIC - DOSED MCG/HR) 100 MCG/HR Place 100 mcg onto the skin every 3 (three) days.    Marland Kitchen. gabapentin (NEURONTIN) 300 MG capsule Take 2 tabs (600mg ) by mouth every morning, 2 tabs (600mg ) at noon, & 2 tabs (600mg ) at bedtime    . insulin aspart (NOVOLOG FLEXPEN) 100 UNIT/ML injection Inject 4-8 Units into the skin 3 (three) times daily with meals. Sliding scale :cbg 200, gives 4 units. cbg 250 6 units    . insulin NPH-regular Human (NOVOLIN 70/30) (70-30) 100 UNIT/ML injection 12 units every morning & 4 units every evening    . nitroGLYCERIN (NITROSTAT) 0.4 MG SL tablet Place 1 tablet (0.4 mg total) under the tongue every 5 (five) minutes as needed.  For chest pain 25 tablet 5  . pindolol (VISKEN) 5 MG tablet Take 1 tablet (5 mg total) by mouth 2 (two) times daily. 60 tablet 6  . ranitidine (ZANTAC) 300 MG tablet Take 300 mg by mouth at bedtime.    . simvastatin (ZOCOR) 40 MG tablet Take 40 mg by mouth every evening.     No current facility-administered medications for this visit.    Allergies:  Ampicillin   Social History: The patient  reports that she has never smoked. She has never used smokeless tobacco. She reports that she does not drink alcohol or use illicit drugs.     ROS:  Please see the history of present illness. Otherwise, complete review of systems is positive for problems with left arm  weakness.  All other systems are reviewed and negative.    Physical Exam: VS:  BP 95/62 mmHg  Pulse 58  Ht  (1.702 m)  Wt 127 lb 12.8 oz (57.97 kg)  BMI 20.01 kg/m2  SpO2 95%, BMI Body mass index is 20.01 kg/(m^2).  Wt Readings from Last 3 Encounters:  12/01/14 127 lb 12.8 oz (57.97 kg)  01/26/14 136 lb 6.4 oz (61.871 kg)  07/21/13 123 lb (55.792 kg)     Chronically ill-appearing woman, no acute distress.  HEENT: Conjunctiva and lids normal, oropharynx clear with poor dentition.  Neck: Supple, no elevated JVP or carotid bruits, no thyromegaly.  Lungs: Clear to auscultation, nonlabored breathing at rest.  Cardiac: Regular rate and rhythm, no S3 or significant systolic murmur, no pericardial rub.  Abdomen: Soft, nontender, bowel sounds present.  Extremities: No pitting edema, distal pulses 2+. Scar left forearm as before. Skin: Warm and dry.  Musculoskeletal: No kyphosis.  Neuropsychiatric: Alert and oriented x3, affect grossly appropriate.   ECG: ECG is not ordered today.   Other Studies Reviewed Today:  Cardiac catheterization 12/26/2011: Procedural Findings: Hemodynamics: AO 106/52  Coronary angiography: Coronary dominance: right  Left mainstem: The left main stem is widely patent. There is no significant angiographic disease.  Left anterior descending (LAD): The LAD is patent to the apex of the heart. There is no significant obstructive disease throughout. The first diagonal is patent with no significant disease  Left circumflex (LCx): The circumflex is patent. There are 3 obtuse marginal branches with no significant obstructive disease. The vessel is smooth throughout its course with no significant stenosis.  Right coronary artery (RCA): The RCA is a moderate caliber, dominant vessel. There are stents at the junction of the mid and distal vessel, widely patent without significant in-stent restenosis. The remaining portions of the right coronary artery  are smooth. There was mild vasospasm off of the distal tip of the catheter in the proximal RCA. This appeared nonobstructive. The PDA is patent. The posterolateral branches are very small. There are no significant stenoses throughout the course of the right coronary artery.  Final Conclusions:  1. Widely patent stents in the right coronary artery 2. No significant obstructive coronary artery disease   Assessment and Plan:  1. CAD status post DES to the RCA in 2008 as well as 2010. She is reporting history of progressive exertional shortness of breath and diaphoresis over the last few weeks. Last ischemic evaluation is in 2013 with widely patent stent sites at that time and otherwise no significant obstructive CAD. We will plan a follow-up Lexiscan Cardiolite on medical therapy to reassess ischemic burden.  2. Essential hypertension, blood pressure low normal today, patient asymptomatic.  3. Type 2  diabetes mellitus, followed by Dr. Sherril Croon.  4. Hyperlipidemia, on Zocor. Will request most recent lab work.   Current medicines are reviewed at length with the patient today.  The patient does not have concerns regarding medicines.    Orders Placed This Encounter  Procedures  . NM Myocar Multi W/Spect W/Wall Motion / EF  . Myocardial Perfusion Imaging    Disposition: FU with me in 6 months depending on results of Cardiolite.   Signed, Jonelle Sidle, MD, [facility] 12/01/2014 2:59 PM    [facility] at [facility] [address] [place], [place], [place] [address] Phone: [phone]; Fax: [phone]

## 2014-12-07 ENCOUNTER — Ambulatory Visit (HOSPITAL_COMMUNITY): Admission: RE | Admit: 2014-12-07 | Payer: Medicaid Other | Source: Ambulatory Visit

## 2014-12-07 ENCOUNTER — Encounter (HOSPITAL_COMMUNITY): Payer: Medicaid Other

## 2014-12-13 ENCOUNTER — Other Ambulatory Visit (HOSPITAL_COMMUNITY): Payer: Medicaid Other

## 2014-12-15 ENCOUNTER — Encounter (HOSPITAL_COMMUNITY)
Admission: RE | Admit: 2014-12-15 | Discharge: 2014-12-15 | Disposition: A | Discharge status: Home / Self Care | Payer: Medicaid Other | Source: Ambulatory Visit | Attending: Cardiology | Admitting: Cardiology

## 2014-12-15 ENCOUNTER — Ambulatory Visit (HOSPITAL_COMMUNITY)
Admission: RE | Admit: 2014-12-15 | Discharge: 2014-12-15 | Disposition: A | Discharge status: Home / Self Care | Payer: Medicaid Other | Source: Ambulatory Visit | Attending: Cardiology | Admitting: Cardiology

## 2014-12-15 ENCOUNTER — Encounter (HOSPITAL_COMMUNITY): Payer: Self-pay

## 2014-12-15 DIAGNOSIS — R0602 Shortness of breath: Secondary | ICD-10-CM | POA: Insufficient documentation

## 2014-12-15 DIAGNOSIS — I2510 Atherosclerotic heart disease of native coronary artery without angina pectoris: Secondary | ICD-10-CM | POA: Diagnosis present

## 2014-12-15 DIAGNOSIS — R0600 Dyspnea, unspecified: Secondary | ICD-10-CM | POA: Insufficient documentation

## 2014-12-15 MED ORDER — SODIUM CHLORIDE 0.9 % IJ SOLN
10.0000 mL | INTRAMUSCULAR | Status: DC | PRN
  Administered 2014-12-15: 10 mL via INTRAVENOUS
  Filled 2014-12-15: qty 10

## 2014-12-15 MED ORDER — REGADENOSON 0.4 MG/5ML IV SOLN
INTRAVENOUS | Status: AC
  Administered 2014-12-15: 0.4 mg via INTRAVENOUS
  Filled 2014-12-15: qty 5

## 2014-12-15 MED ORDER — SODIUM CHLORIDE 0.9 % IJ SOLN
INTRAMUSCULAR | Status: AC
  Administered 2014-12-15: 10 mL via INTRAVENOUS
  Filled 2014-12-15: qty 3

## 2014-12-15 MED ORDER — REGADENOSON 0.4 MG/5ML IV SOLN
0.4000 mg | Freq: Once | INTRAVENOUS | Status: AC | PRN
  Administered 2014-12-15: 0.4 mg via INTRAVENOUS

## 2014-12-15 MED ORDER — TECHNETIUM TC 99M SESTAMIBI - CARDIOLITE
30.0000 | Freq: Once | INTRAVENOUS | Status: AC | PRN
  Administered 2014-12-15: 30 via INTRAVENOUS

## 2014-12-15 MED ORDER — TECHNETIUM TC 99M SESTAMIBI GENERIC - CARDIOLITE
10.0000 | Freq: Once | INTRAVENOUS | Status: AC | PRN
  Administered 2014-12-15: 10 via INTRAVENOUS

## 2014-12-15 NOTE — Progress Notes (Signed)
Stress Lab Nurses Notes - [facility]  Leroy Seaeresa A Wurster 12/15/2014 Reason for doing test: CAD and Dyspnea Type of test: Marlane HatcherLexiscan Cardiolite Nurse performing test: Parke PoissonPhyllis Billingsly, RN Nuclear Medicine Tech: Lou CalLeslie Walker Echo Tech: Not Applicable MD performing test: Branch/D.Dunn PA Family MD: Vyas Test explained and consent signed: Yes.   IV started: Saline lock flushed, No redness or edema and Saline lock started in radiology Symptoms: chest discomfort Treatment/Intervention: None Reason test stopped: protocol completed After recovery IV was: Discontinued via X-ray tech and No redness or edema Patient to return to Nuc. Med at : 11:30 Patient discharged: Home Patient's Condition upon discharge was: stable Comments: During test BP 110/68 & HR 102.  Recovery BP 120/64 & HR 74.  Symptoms resolved in recovery. Erskine SpeedBillingsley, Oaklee Esther T

## 2014-12-17 ENCOUNTER — Other Ambulatory Visit: Payer: Self-pay | Admitting: *Deleted

## 2014-12-17 ENCOUNTER — Telehealth: Payer: Self-pay | Admitting: *Deleted

## 2014-12-17 MED ORDER — PINDOLOL 5 MG PO TABS: 5.0000 mg | ORAL_TABLET | Freq: Two times a day (BID) | ORAL | Status: DC

## 2014-12-17 NOTE — Telephone Encounter (Signed)
Patient informed. 

## 2014-12-17 NOTE — Telephone Encounter (Signed)
-----   Message from Antoine PocheJonathan F Branch, MD sent at 12/16/2014  2:06 PM EDT ----- Normal stress test.  Dominga FerryJ Branch MD

## 2014-12-23 ENCOUNTER — Telehealth: Payer: Self-pay | Admitting: Cardiology

## 2014-12-23 NOTE — Telephone Encounter (Signed)
Haley [name] called stating that she was told her stress test was normal but she was under the impression we would be calling Her back about pictures of the stress test.  She is still having shortness of breath. Please call patient.

## 2014-12-23 NOTE — Telephone Encounter (Signed)
Pt wanting clarification on if stress test was normal. Explained that results were reviewed by Dr. Wyline MoodBranch on Dr. Ival BibleMcDowell's absence, then reviewed by Dr. Diona BrownerMcDowell upon return to office, both stated that stress test was normal and continue present therapy. Pt understood and instructed to f/u with Dr. Sherril CroonVyas regarding other non cardiac causes

## 2015-03-25 ENCOUNTER — Telehealth: Payer: Self-pay | Admitting: Cardiology

## 2015-03-25 DIAGNOSIS — E785 Hyperlipidemia, unspecified: Secondary | ICD-10-CM

## 2015-03-25 DIAGNOSIS — Z5181 Encounter for therapeutic drug level monitoring: Secondary | ICD-10-CM

## 2015-03-25 NOTE — Telephone Encounter (Signed)
Patient's prescription from simvastatin at home is 10 mg and the profile that was given to patient from our office say's 40 mg. Patient said that Dr. Sherril CroonVyas does not check her cholesterol. [facility] contacted to confirm how long 10 mg has been prescribed. Per [facility] pharmacist, patient has been getting simvastatin 10 mg since April 2015 and has been filled by Dr. Sherril CroonVyas. Please advise.

## 2015-03-25 NOTE — Telephone Encounter (Signed)
Patient informed and verbalized understanding of plan. Lab orders faxed to [facility] lab. 

## 2015-03-25 NOTE — Telephone Encounter (Signed)
Needs to verify medication not sure correct dosage she is to take

## 2015-03-25 NOTE — Telephone Encounter (Signed)
Lab work was requested from Dr. Sherril CroonVyas after my last visit with the patient. Lipids were not present. Recommend recheck FLP and LFT, then can decide about appropriate dose of statin. For now would continue Zocor 10 mg daily.

## 2015-03-30 ENCOUNTER — Telehealth: Payer: Self-pay | Admitting: *Deleted

## 2015-03-30 NOTE — Telephone Encounter (Signed)
-----   Message from Jonelle SidleSamuel G McDowell, MD sent at 03/29/2015 10:53 AM EDT ----- Reviewed. AST and ALT are normal. Total cholesterol 164 and LDL 69. If she has been taking Zocor 10 mg daily, would continue this dose.

## 2015-03-30 NOTE — Telephone Encounter (Signed)
Patient informed. 

## 2015-06-07 ENCOUNTER — Ambulatory Visit (INDEPENDENT_AMBULATORY_CARE_PROVIDER_SITE_OTHER): Payer: Medicaid Other | Admitting: Cardiology

## 2015-06-07 ENCOUNTER — Encounter: Payer: Self-pay | Admitting: Cardiology

## 2015-06-07 VITALS — BP 96/60 | HR 59 | Ht 67.0 in | Wt 126.0 lb

## 2015-06-07 DIAGNOSIS — I10 Essential (primary) hypertension: Secondary | ICD-10-CM | POA: Diagnosis not present

## 2015-06-07 DIAGNOSIS — E1159 Type 2 diabetes mellitus with other circulatory complications: Secondary | ICD-10-CM

## 2015-06-07 DIAGNOSIS — E785 Hyperlipidemia, unspecified: Secondary | ICD-10-CM | POA: Diagnosis not present

## 2015-06-07 DIAGNOSIS — I471 Supraventricular tachycardia: Secondary | ICD-10-CM | POA: Diagnosis not present

## 2015-06-07 DIAGNOSIS — I2510 Atherosclerotic heart disease of native coronary artery without angina pectoris: Secondary | ICD-10-CM

## 2015-06-07 MED ORDER — NITROGLYCERIN 0.4 MG SL SUBL: 0.4000 mg | SUBLINGUAL_TABLET | SUBLINGUAL | Status: DC | PRN

## 2015-06-07 NOTE — Patient Instructions (Signed)
Your physician recommends that you continue on your current medications as directed. Please refer to the Current Medication list given to you today. Your physician recommends that you schedule a follow-up appointment in: 6 months. You will receive a reminder letter in the mail in about 4 months reminding you to call and schedule your appointment. If you don't receive this letter, please contact our office. 

## 2015-06-07 NOTE — Progress Notes (Signed)
Cardiology Office Note  Date: 06/07/2015   ID: Haley [name], DOB 05-Dec-1960, MRN [id number]  PCP: Haley [name]., MD  Primary Cardiologist: Haley Dell, MD   Chief Complaint  Patient presents with  . Coronary Artery Disease    History of Present Illness: Haley [name] is a 54 y.o. female last seen in March. She is here today with a significant other for follow-up. Her main complaint focuses on chronic left arm pain. He states that this has been a problem for the last few years, she follows in a pain clinic in [place], and reports evaluation pending by an orthopedic specialist later in the month. She is under the impression that she may be in need of further surgery on her left arm. I do not have all the details.  Interval follow-up ischemic testing is reviewed below, overall low risk. We discussed the results today. She does not endorse any active angina symptoms. Cardiac medications were reviewed. She does not endorse any increasing nitroglycerin requirement.  Recent lipid panel as outlined below shows good LDL control on low-dose Zocor.  Patient states she is not very active, relates a lot of this to her pain medications and a feeling of "being in a daze."  She continues to follow with Dr. Sherril [name] for primary care.   Past Medical History  Diagnosis Date  . Type 2 diabetes mellitus   . Essential hypertension, benign   . Seizures   . Coronary atherosclerosis of native coronary artery     DES to mid RCA 01/2007 and DES to mid RCA 01/2009; LVEF normal  . Chronic pain syndrome   . Mononeuritis multiplex   . Atrial tachycardia     Noninducible at EP study  . GERD (gastroesophageal reflux disease)   . Nephrolithiasis   . Pulmonary nodules   . Pneumonia   . Hyperlipidemia   . Anxiety   . Depression     Past Surgical History  Procedure Laterality Date  . Cholecystectomy    . Abdominal hysterectomy      total  . Right knee surgery    . Left heart catheterization  with coronary angiogram N/A 12/26/2011    Procedure: LEFT HEART CATHETERIZATION WITH CORONARY ANGIOGRAM;  Surgeon: Tonny Bollman, MD;  Location: [facility] CATH LAB;  Service: Cardiovascular;  Laterality: N/A;  . Left heart catheterization with coronary angiogram N/A 01/08/2013    Procedure: LEFT HEART CATHETERIZATION WITH CORONARY ANGIOGRAM;  Surgeon: Wendall Stade, MD;  Location: [facility] CATH LAB;  Service: Cardiovascular;  Laterality: N/A;    Current Outpatient Prescriptions  Medication Sig Dispense Refill  . aspirin EC 81 MG tablet Take 81 mg by mouth daily.    . citalopram (CELEXA) 40 MG tablet Take 40 mg by mouth daily.    . clonazePAM (KLONOPIN) 0.5 MG tablet Take 0.5 mg by mouth 3 (three) times daily as needed for anxiety.    . clopidogrel (PLAVIX) 75 MG tablet Take 75 mg by mouth daily.      . fentaNYL (DURAGESIC - DOSED MCG/HR) 100 MCG/HR Place 100 mcg onto the skin every 3 (three) days.    Marland Kitchen gabapentin (NEURONTIN) 300 MG capsule Take 2 tabs ( ) by mouth every morning, 2 tabs ( ) at noon, & 3 tabs ( ) at dinner, & 4 (1,200mg ) tabs at bedtime    . insulin aspart (NOVOLOG FLEXPEN) 100 UNIT/ML injection Inject 4-8 Units into the skin 3 (three) times daily with meals. Sliding scale :cbg 200, gives 4 units. cbg 250  6 units    . insulin NPH-regular Human (NOVOLIN 70/30) (70-30) 100 UNIT/ML injection 12 units every morning & 4 units every evening    . nitroGLYCERIN (NITROSTAT) 0.4 MG SL tablet Place 1 tablet (0.4 mg total) under the tongue every 5 (five) minutes as needed. For chest pain 25 tablet 5  . pindolol (VISKEN) 5 MG tablet Take 1 tablet (5 mg total) by mouth 2 (two) times daily. 60 tablet 6  . ranitidine (ZANTAC) 300 MG tablet Take 300 mg by mouth at bedtime.    . simvastatin (ZOCOR) 10 MG tablet Take 1 tablet (10 mg total) by mouth at bedtime. 90 tablet 0   No current facility-administered medications for this visit.    Allergies:  Ampicillin   Social History: The patient   reports that she has never smoked. She has never used smokeless tobacco. She reports that she does not drink alcohol or use illicit drugs.   ROS:  Please see the history of present illness. Otherwise, complete review of systems is positive for chronic left arm pain, fatigue.  All other systems are reviewed and negative.   Physical Exam: VS:  BP 96/60 mmHg  Pulse 59  Ht  (1.702 m)  Wt 126 lb (57.153 kg)  BMI 19.73 kg/m2  SpO2 97%, BMI Body mass index is 19.73 kg/(m^2).  Wt Readings from Last 3 Encounters:  06/07/15 126 lb (57.153 kg)  12/01/14 127 lb 12.8 oz (57.97 kg)  01/26/14 136 lb 6.4 oz (61.871 kg)     Chronically ill-appearing woman, no acute distress.  HEENT: Conjunctiva and lids normal, oropharynx clear with poor dentition.  Neck: Supple, no elevated JVP or carotid bruits, no thyromegaly.  Lungs: Clear to auscultation, nonlabored breathing at rest.  Cardiac: Regular rate and rhythm, no S3 or significant systolic murmur, no pericardial rub.  Abdomen: Soft, nontender, bowel sounds present.  Extremities: No pitting edema, distal pulses 2+. Scar left forearm as before. Wearing a mitten on her left hand. Skin: Warm and dry.  Musculoskeletal: No kyphosis.  Neuropsychiatric: Alert and oriented x3, affect grossly appropriate.   ECG: ECG is ordered today and shows sinus bradycardia.   Recent Labwork:  03/28/2015: AST 24, ALT 22, cholesterol 164, triglycerides 77, HDL 80, LDL 69.  Other Studies Reviewed Today:  Lexiscan Cardiolite 12/15/2014: FINDINGS: Pharmacological stress  Baseline EKG shows normal sinus rhythm. After injection heart rate increased from 51 beats per min up to 102 beats per min, and blood pressure decreased from 120/69 to 110/68. The test was stopped after injection was complete. The patient did have some chest pain after injection. Post-injection EKG shows inferior downsloping ST depressions of 2 mm and 1 mm downsloping ST depressions in  the lateral precordial leads.  Perfusion: There is a small mild intensity fixed basal inferoseptal wall defect. There is normal wall motion of this segment. There is noted radiotracer uptake in the gut adjacent to this wall segment. Findings likely consistent with artifact. There are no other perfusion defects.  Wall Motion: Normal left ventricular wall motion. No left ventricular dilation.  Left Ventricular Ejection Fraction: 63 %  End diastolic volume 63 ml  End systolic volume 23 ml  IMPRESSION: 1. No reversible ischemia or infarction. Abnormal EKG after Lexiscan injection with transient ST depressions as described above. Imaging is negative for ischemia.  2. Normal left ventricular wall motion.  3. Left ventricular ejection fraction 63%  4. Low-risk stress test findings*.  Assessment and Plan:  1. CAD status post  DES to the RCA in 2008 as well as 2010 as outlined above, stable at cardiac catheterization in 2013, and with recent low risk Cardiolite. Plan is to continue medical therapy and observation.  2. Hyperlipidemia, LDL 69 on low-dose Zocor.  3. Type 2 diabetes mellitus, followed by Dr. Sherril [name].  4. Essential hypertension, blood pressure low normal today.  Current medicines were reviewed with the patient today.   Orders Placed This Encounter  Procedures  . EKG 12-Lead    Disposition: FU with me in 6 months.   Signed, Jonelle Sidle, MD, [facility] 06/07/2015 1:32 PM    [facility] at [facility] [address] [place], [place], [place] [address] Phone: [phone]; Fax: [phone]

## 2015-07-19 ENCOUNTER — Telehealth: Payer: Self-pay | Admitting: Cardiology

## 2015-07-19 NOTE — Telephone Encounter (Signed)
Pt called and wanted permission to hold Plavix for "nerve stimulator implant". I told her this would be OK, resume ASAP post op.   Corine ShelterLUKE Raeana Blinn PA-C 07/19/2015 6:18 PM

## 2015-11-03 ENCOUNTER — Other Ambulatory Visit: Payer: Self-pay | Admitting: Cardiology

## 2015-11-25 ENCOUNTER — Ambulatory Visit (INDEPENDENT_AMBULATORY_CARE_PROVIDER_SITE_OTHER): Payer: Medicaid Other | Admitting: Cardiology

## 2015-11-25 ENCOUNTER — Encounter: Payer: Self-pay | Admitting: Cardiology

## 2015-11-25 VITALS — BP 84/54 | HR 60 | Ht 67.0 in | Wt 120.4 lb

## 2015-11-25 DIAGNOSIS — I2510 Atherosclerotic heart disease of native coronary artery without angina pectoris: Secondary | ICD-10-CM

## 2015-11-25 DIAGNOSIS — E785 Hyperlipidemia, unspecified: Secondary | ICD-10-CM

## 2015-11-25 NOTE — Patient Instructions (Signed)
Your physician recommends that you continue on your current medications as directed. Please refer to the Current Medication list given to you today. Your physician recommends that you schedule a follow-up appointment in: 6 months. You will receive a reminder letter in the mail in about 4 months reminding you to call and schedule your appointment. If you don't receive this letter, please contact our office. 

## 2015-11-25 NOTE — Progress Notes (Signed)
Cardiology Office Note  Date: 11/25/2015   ID: Haley Seaeresa A Dilday, DOB 06/19/1961, MRN [id number]  PCP: Ignatius SpeckingVYAS,DHRUV B., MD  Primary Cardiologist: Nona DellSamuel McDowell, MD   Chief Complaint  Patient presents with  . Coronary Artery Disease    History of Present Illness: Haley [name] is a 55 y.o. female last seen in September 2016. She presents for a routine follow-up visit. From a cardiac perspective she reports no angina symptoms or progressive shortness of breath. Ischemic testing from last year was overall low risk.  Chart indicates that she underwent placement of a left peripheral nerve stimulator in November 2016 [facility]. She follows in the pain clinic at that facility. She also continues to follow with Dr. Sherril CroonVyas for health maintenance. Reports recent increase in hemoglobin A1c from 6.4-7.4.  Reviewed her medications, cardiac regimen includes aspirin, Plavix, Zocor, pindolol, and as needed nitroglycerin.  Past Medical History  Diagnosis Date  . Type 2 diabetes mellitus (HCC)   . Essential hypertension, benign   . Seizures (HCC)   . Coronary atherosclerosis of native coronary artery     DES to mid RCA 01/2007 and DES to mid RCA 01/2009; LVEF normal  . Chronic pain syndrome   . Mononeuritis multiplex   . Atrial tachycardia (HCC)     Noninducible at EP study  . GERD (gastroesophageal reflux disease)   . Nephrolithiasis   . Pulmonary nodules   . Pneumonia   . Hyperlipidemia   . Anxiety   . Depression     Current Outpatient Prescriptions  Medication Sig Dispense Refill  . aspirin EC 81 MG tablet Take 81 mg by mouth daily.    . citalopram (CELEXA) 40 MG tablet Take 40 mg by mouth daily.    . clonazePAM (KLONOPIN) 0.5 MG tablet Take 0.5 mg by mouth 3 (three) times daily as needed for anxiety.    . cloNIDine (CATAPRES - DOSED IN MG/24 HR) 0.1 mg/24hr patch Place 1 patch onto the skin once a week.    . clopidogrel (PLAVIX) 75 MG tablet Take 75 mg by mouth daily.      . fentaNYL  (DURAGESIC - DOSED MCG/HR) 100 MCG/HR Place 100 mcg onto the skin every 3 (three) days.    Marland Kitchen. gabapentin (NEURONTIN) 300 MG capsule Take 2 tabs (600mg ) by mouth every morning, 2 tabs (600mg ) at noon, & 3 tabs (900mg ) at dinner, & 4 (1,200mg ) tabs at bedtime    . insulin aspart (NOVOLOG FLEXPEN) 100 UNIT/ML injection Inject 4-8 Units into the skin 3 (three) times daily with meals. Sliding scale :cbg 200, gives 4 units. cbg 250 6 units    . insulin NPH-regular Human (NOVOLIN 70/30) (70-30) 100 UNIT/ML injection 12 units every morning & 4 units every evening    . nitroGLYCERIN (NITROSTAT) 0.4 MG SL tablet Place 1 tablet (0.4 mg total) under the tongue every 5 (five) minutes x 3 doses as needed. For chest pain. If no relief after 3rd dose, proceed to the ED for an evaluation 25 tablet 3  . pindolol (VISKEN) 5 MG tablet TAKE ONE TABLET BY MOUTH TWICE DAILY 60 tablet 6  . ranitidine (ZANTAC) 300 MG tablet Take 300 mg by mouth at bedtime.    . simvastatin (ZOCOR) 10 MG tablet Take 1 tablet (10 mg total) by mouth at bedtime. 90 tablet 0   No current facility-administered medications for this visit.   Allergies:  Ampicillin   Social History: The patient  reports that she has never smoked.  She has never used smokeless tobacco. She reports that she does not drink alcohol or use illicit drugs.   ROS:  Please see the history of present illness. Otherwise, complete review of systems is positive for chronic left arm and hand pain.  All other systems are reviewed and negative.   Physical Exam: VS:  BP 84/54 mmHg  Pulse 60  Ht  (1.702 m)  Wt 120 lb 6.4 oz (54.613 kg)  BMI 18.85 kg/m2  SpO2 98%, BMI Body mass index is 18.85 kg/(m^2).  Wt Readings from Last 3 Encounters:  11/25/15 120 lb 6.4 oz (54.613 kg)  06/07/15 126 lb (57.153 kg)  12/01/14 127 lb 12.8 oz (57.97 kg)    Chronically ill-appearing woman, no acute distress.  HEENT: Conjunctiva and lids normal, oropharynx clear with poor dentition.   Neck: Supple, no elevated JVP or carotid bruits, no thyromegaly.  Lungs: Clear to auscultation, nonlabored breathing at rest.  Cardiac: Regular rate and rhythm, no S3 or significant systolic murmur, no pericardial rub.  Abdomen: Soft, nontender, bowel sounds present.  Extremities: No pitting edema, distal pulses 2+. Wearing a mitten on her left hand.  ECG: I personally reviewed the prior tracing from 06/07/2015 which showed sinus bradycardia with small R' in lead V1 and V2.  Recent Labwork:  July 2016: AST 24, ALT 22, triglycerides 77, cholesterol 164, HDL 80, LDL 69  Other Studies Reviewed Today:  Lexiscan Cardiolite 12/15/2014: FINDINGS: Pharmacological stress  Baseline EKG shows normal sinus rhythm. After injection heart rate increased from 51 beats per min up to 102 beats per min, and blood pressure decreased from 120/69 to 110/68. The test was stopped after injection was complete. The patient did have some chest pain after injection. Post-injection EKG shows inferior downsloping ST depressions of 2 mm and 1 mm downsloping ST depressions in the lateral precordial leads.  Perfusion: There is a small mild intensity fixed basal inferoseptal wall defect. There is normal wall motion of this segment. There is noted radiotracer uptake in the gut adjacent to this wall segment. Findings likely consistent with artifact. There are no other perfusion defects.  Wall Motion: Normal left ventricular wall motion. No left ventricular dilation.  Left Ventricular Ejection Fraction: 63 %  End diastolic volume 63 ml  End systolic volume 23 ml  IMPRESSION: 1. No reversible ischemia or infarction. Abnormal EKG after Lexiscan injection with transient ST depressions as described above. Imaging is negative for ischemia.  2. Normal left ventricular wall motion.  3. Left ventricular ejection fraction 63%  4. Low-risk stress test findings*.  Assessment and Plan:  1.  Symptomatically stable CAD status post DES to the RCA in 2008 and 2010. Ischemic testing from last year was low risk. Continue medical therapy and observation.  2. Hyperlipidemia, on Zocor. She continues to follow with Dr. Sherril Croon, last LDL was 69.  Current medicines were reviewed with the patient today.  Disposition: FU with me in 6 months.   Signed, Jonelle Sidle, MD, [facility] 11/25/2015 2:29 PM    [facility] at [facility] [address] [place], [place], [place] [address] Phone: [phone]; Fax: [phone]

## 2016-03-12 IMAGING — NM NM MYOCAR MULTI W/SPECT W/WALL MOTION & EF
2 series · 12 of 12 positions shown · non-contrast
Comparison: None.

CLINICAL DATA: 54-year-old female with a known history of coronary
artery disease referred for shortness of breath

EXAM:
MYOCARDIAL IMAGING WITH SPECT (REST AND PHARMACOLOGIC-STRESS)
GATED LEFT VENTRICULAR WALL MOTION STUDY
LEFT VENTRICULAR EJECTION FRACTION
TECHNIQUE: Standard myocardial SPECT imaging was performed after resting
intravenous injection of 10 mCi Tc-VVm sestamibi. Subsequently,
intravenous infusion of Lexiscan was performed under the supervision
of the Cardiology staff. At peak effect of the drug, 30 mCi Tc-VVm
sestamibi was injected intravenously and standard myocardial SPECT
imaging was performed. Quantitative gated imaging was also performed
to evaluate left ventricular wall motion, and estimate left
ventricular ejection fraction.

[Series 1: rest · 8.28mm/px · 6 of 64 frames shown]
[frame 6/64]
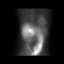
[frame 16/64]
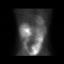
[frame 27/64]
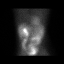
[frame 38/64]
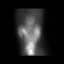
[frame 48/64]
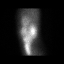
[frame 59/64]
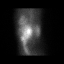

[Series 2: stress gated · 8.28mm/px · 6 of 64 frames shown]
[frame 6/64]
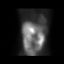
[frame 16/64]
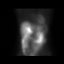
[frame 27/64]
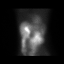
[frame 38/64]
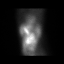
[frame 48/64]
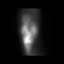
[frame 59/64]
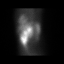

[12 of 12 positions shown; findings below may reference images not displayed]

FINDINGS: Pharmacological stress

Baseline EKG shows normal sinus rhythm. After injection heart rate
increased from 51 beats per min up to 102 beats per min, and blood
pressure decreased from 120/69 to 110/68. The test was stopped after
injection was complete. The patient did have some chest pain after
injection. Post-injection EKG shows inferior downsloping ST
depressions of 2 mm and 1 mm downsloping ST depressions in the
lateral precordial leads.

Perfusion: There is a small mild intensity fixed basal inferoseptal
wall defect. There is normal wall motion of this segment. There is
noted radiotracer uptake in the gut adjacent to this wall segment.
Findings likely consistent with artifact. There are no other
perfusion defects.

Wall Motion: Normal left ventricular wall motion. No left
ventricular dilation.

Left Ventricular Ejection Fraction: 63 %

End diastolic volume 63 ml

End systolic volume 23 ml
IMPRESSION: 1. No reversible ischemia or infarction. Abnormal EKG after Lexiscan
injection with transient ST depressions as described above. Imaging
is negative for ischemia.

2. Normal left ventricular wall motion.

3. Left ventricular ejection fraction 63%

4. Low-risk stress test findings*.

*9489 Appropriate Use Criteria for Coronary Revascularization
Focused Update: J Am Coll Cardiol. 9489;59(9):857-881.
[URL]

## 2016-06-07 ENCOUNTER — Encounter: Payer: Self-pay | Admitting: *Deleted

## 2016-06-08 ENCOUNTER — Ambulatory Visit (INDEPENDENT_AMBULATORY_CARE_PROVIDER_SITE_OTHER): Payer: Medicaid Other | Admitting: Cardiology

## 2016-06-08 ENCOUNTER — Encounter: Payer: Self-pay | Admitting: Cardiology

## 2016-06-08 VITALS — BP 122/62 | HR 62 | Ht 67.0 in | Wt 126.0 lb

## 2016-06-08 DIAGNOSIS — I2510 Atherosclerotic heart disease of native coronary artery without angina pectoris: Secondary | ICD-10-CM

## 2016-06-08 DIAGNOSIS — I498 Other specified cardiac arrhythmias: Secondary | ICD-10-CM

## 2016-06-08 DIAGNOSIS — I491 Atrial premature depolarization: Secondary | ICD-10-CM

## 2016-06-08 DIAGNOSIS — E785 Hyperlipidemia, unspecified: Secondary | ICD-10-CM

## 2016-06-08 MED ORDER — PINDOLOL 5 MG PO TABS: 5.0000 mg | ORAL_TABLET | Freq: Two times a day (BID) | ORAL | 6 refills | Status: DC

## 2016-06-08 MED ORDER — NITROGLYCERIN 0.4 MG SL SUBL: 0.4000 mg | SUBLINGUAL_TABLET | SUBLINGUAL | 3 refills | Status: DC | PRN

## 2016-06-08 NOTE — Patient Instructions (Signed)

## 2016-06-08 NOTE — Progress Notes (Signed)
Cardiology Office Note  Date: 06/08/2016   ID: Haley [name], DOB 06/18/1961, MRN [id number]  PCP: Ignatius Speckinghruv B Vyas, MD  Primary Cardiologist: Nona DellSamuel McDowell, MD   Chief Complaint  Patient presents with  . Coronary Artery Disease    History of Present Illness: Haley [name] is a 55 y.o. female last seen in March. She presents for a routine follow-up visit. States that she has been doing well, no angina symptoms. She has been walking regularly in the morning for exercise. I reviewed her ECG today which shows an ectopic atrial rhythm with nonspecific ST changes.  Stress testing from last year is outlined below, low risk.  I reviewed her medications. She continues on aspirin, Plavix, pindolol, Zocor, and as needed nitroglycerin.  Past Medical History:  Diagnosis Date  . Anxiety   . Atrial tachycardia (HCC)    Noninducible at EP study  . Chronic pain syndrome   . Coronary atherosclerosis of native coronary artery    DES to mid RCA 01/2007 and DES to mid RCA 01/2009; LVEF normal  . Depression   . Essential hypertension, benign   . GERD (gastroesophageal reflux disease)   . Hyperlipidemia   . Mononeuritis multiplex   . Nephrolithiasis   . Pneumonia   . Pulmonary nodules   . Seizures (HCC)   . Type 2 diabetes mellitus (HCC)     Current Outpatient Prescriptions  Medication Sig Dispense Refill  . aspirin EC 81 MG tablet Take 81 mg by mouth daily.    . citalopram (CELEXA) 40 MG tablet Take 40 mg by mouth daily.    . clonazePAM (KLONOPIN) 0.5 MG tablet Take 0.5 mg by mouth 3 (three) times daily as needed for anxiety.    . cloNIDine (CATAPRES - DOSED IN MG/24 HR) 0.1 mg/24hr patch Place 1 patch onto the skin once a week.    . clopidogrel (PLAVIX) 75 MG tablet Take 75 mg by mouth daily.      . fentaNYL (DURAGESIC - DOSED MCG/HR) 100 MCG/HR Place 100 mcg onto the skin every 3 (three) days.    Marland Kitchen. gabapentin (NEURONTIN) 300 MG capsule Take 2 tabs (600mg ) by mouth every morning, 2  tabs (600mg ) at noon, & 3 tabs (900mg ) at dinner, & 4 (1,200mg ) tabs at bedtime    . insulin aspart (NOVOLOG FLEXPEN) 100 UNIT/ML injection Inject 4-8 Units into the skin 3 (three) times daily with meals. Sliding scale :cbg 200, gives 4 units. cbg 250 6 units    . insulin NPH-regular Human (NOVOLIN 70/30) (70-30) 100 UNIT/ML injection 12 units every morning & 4 units every evening    . nitroGLYCERIN (NITROSTAT) 0.4 MG SL tablet Place 1 tablet (0.4 mg total) under the tongue every 5 (five) minutes x 3 doses as needed. For chest pain. If no relief after 3rd dose, proceed to the ED for an evaluation 25 tablet 3  . pindolol (VISKEN) 5 MG tablet Take 1 tablet (5 mg total) by mouth 2 (two) times daily. 60 tablet 6  . ranitidine (ZANTAC) 300 MG tablet Take 300 mg by mouth at bedtime.    . simvastatin (ZOCOR) 10 MG tablet Take 1 tablet (10 mg total) by mouth at bedtime. 90 tablet 0   No current facility-administered medications for this visit.    Allergies:  Ampicillin   Social History: The patient  reports that she has never smoked. She has never used smokeless tobacco. She reports that she does not drink alcohol or use drugs.  ROS:  Please see the history of present illness. Otherwise, complete review of systems is positive for situational stress associated with daughter's illness.  All other systems are reviewed and negative.   Physical Exam: VS:  BP 122/62   Pulse 62   Ht 5\' 7"  (1.702 m)   Wt 126 lb (57.2 kg)   SpO2 97%   BMI 19.73 kg/m , BMI Body mass index is 19.73 kg/m.  Wt Readings from Last 3 Encounters:  06/08/16 126 lb (57.2 kg)  11/25/15 120 lb 6.4 oz (54.6 kg)  06/07/15 126 lb (57.2 kg)    Chronically ill-appearing woman, no acute distress.  HEENT: Conjunctiva and lids normal, oropharynx clear with poor dentition.  Neck: Supple, no elevated JVP or carotid bruits, no thyromegaly.  Lungs: Clear to auscultation, nonlabored breathing at rest.  Cardiac: Regular rate and  rhythm, no S3 or significant systolic murmur, no pericardial rub.  Abdomen: Soft, nontender, bowel sounds present.  Extremities: No pitting edema, distal pulses 2+. Wearing a mitten on her left hand.  ECG: I personally reviewed the tracing from 06/07/2015 which showed sinus bradycardia with R' in V1 and V2.  Recent Labwork:  July 2016: AST 24, ALT 22, cholesterol 164, triglycerides 177, HDL 80, LDL 69  Other Studies Reviewed Today:  Lexiscan Cardiolite 12/15/2014: FINDINGS: Pharmacological stress  Baseline EKG shows normal sinus rhythm. After injection heart rate increased from 51 beats per min up to 102 beats per min, and blood pressure decreased from 120/69 to 110/68. The test was stopped after injection was complete. The patient did have some chest pain after injection. Post-injection EKG shows inferior downsloping ST depressions of 2 mm and 1 mm downsloping ST depressions in the lateral precordial leads.  Perfusion: There is a small mild intensity fixed basal inferoseptal wall defect. There is normal wall motion of this segment. There is noted radiotracer uptake in the gut adjacent to this wall segment. Findings likely consistent with artifact. There are no other perfusion defects.  Wall Motion: Normal left ventricular wall motion. No left ventricular dilation.  Left Ventricular Ejection Fraction: 63 %  End diastolic volume 63 ml  End systolic volume 23 ml  IMPRESSION: 1. No reversible ischemia or infarction. Abnormal EKG after Lexiscan injection with transient ST depressions as described above. Imaging is negative for ischemia.  2. Normal left ventricular wall motion.  3. Left ventricular ejection fraction 63%  4. Low-risk stress test findings*.  Assessment and Plan:  1. Symptomatically stable CAD status post DES to the RCA in 2008 and DES to the RCA in 2010. Follow-up stress testing from last year was low risk. Plan to continue medical therapy and  observation.  2. Ectopic atrial rhythm, asymptomatic. She has a prior history of atrial tachycardia as well. Continues on pindolol. Refill provided.  3. Hyperlipidemia, on Zocor. Keep follow-up with Dr. Sherril Croon.  Current medicines were reviewed with the patient today.   Orders Placed This Encounter  Procedures  . EKG 12-Lead    Disposition: Follow-up with me in 6 months.  Signed, Jonelle Sidle, MD, [facility] 06/08/2016 11:29 AM    [facility] at [facility] [address] [place], [place], [place] [address] Phone: [phone]; Fax: [phone]

## 2016-11-29 ENCOUNTER — Telehealth: Payer: Self-pay | Admitting: Cardiology

## 2016-11-29 NOTE — Telephone Encounter (Signed)
Patient informed that prior authorization initiated today and she should check with the pharmacy in a couple of days to see if it goes through. Patient verbalized understanding.

## 2016-11-29 NOTE — Telephone Encounter (Signed)
Patient called stating that [facility] said that before she can get a refill on pindolol (VISKEN) 5 MG tablet    We would need to pre authorize.  Please call patient.

## 2017-01-15 NOTE — Progress Notes (Signed)
Cardiology Office Note  Date: 01/16/2017   ID: Haley [name], DOB 06/07/1961, MRN [id number]  PCP: Ignatius Specking, MD  Primary Cardiologist: Nona Dell, MD   Chief Complaint  Patient presents with  . Coronary Artery Disease    History of Present Illness: Haley [name] is a 56 y.o. female last seen in September 2017. She presents for a routine follow-up visit. States that overall she has been doing well. She had one episode of chest pain while doing house chores recently, took nitroglycerin. Otherwise no progressive angina symptoms.  She continues to follow in a pain management clinic in [place], also has a nerve stimulator in place. She seems to be satisfied with pain control at this time.  Current cardiac regimen includes aspirin, Plavix, pindolol, Zocor, and nitroglycerin. I personally reviewed her ECG today which shows an ectopic atrial rhythm with nonspecific T-wave changes.  Stress testing from 2016 is outlined below.  Past Medical History:  Diagnosis Date  . Anxiety   . Atrial tachycardia (HCC)    Noninducible at EP study  . Chronic pain syndrome   . Coronary atherosclerosis of native coronary artery    DES to mid RCA 01/2007 and DES to mid RCA 01/2009; LVEF normal  . Depression   . Essential hypertension, benign   . GERD (gastroesophageal reflux disease)   . Hyperlipidemia   . Mononeuritis multiplex   . Nephrolithiasis   . Pneumonia   . Pulmonary nodules   . Seizures (HCC)   . Type 2 diabetes mellitus (HCC)     Past Surgical History:  Procedure Laterality Date  . ABDOMINAL HYSTERECTOMY     total  . CHOLECYSTECTOMY    . LEFT HEART CATHETERIZATION WITH CORONARY ANGIOGRAM N/A 12/26/2011   Procedure: LEFT HEART CATHETERIZATION WITH CORONARY ANGIOGRAM;  Surgeon: Tonny Bollman, MD;  Location: [facility] CATH LAB;  Service: Cardiovascular;  Laterality: N/A;  . LEFT HEART CATHETERIZATION WITH CORONARY ANGIOGRAM N/A 01/08/2013   Procedure: LEFT HEART  CATHETERIZATION WITH CORONARY ANGIOGRAM;  Surgeon: Wendall Stade, MD;  Location: [facility] CATH LAB;  Service: Cardiovascular;  Laterality: N/A;  . Right knee surgery      Current Outpatient Prescriptions  Medication Sig Dispense Refill  . aspirin EC 81 MG tablet Take 81 mg by mouth daily.    . citalopram (CELEXA) 40 MG tablet Take 40 mg by mouth daily.    . clonazePAM (KLONOPIN) 0.5 MG tablet Take 0.5 mg by mouth 3 (three) times daily as needed for anxiety.    . clopidogrel (PLAVIX) 75 MG tablet Take 75 mg by mouth daily.      . fentaNYL (DURAGESIC - DOSED MCG/HR) 100 MCG/HR Place 100 mcg onto the skin every 3 (three) days.    Marland Kitchen gabapentin (NEURONTIN) 300 MG capsule Take 300 mg by mouth. Take 2 tablets at 5pm and 3 tablets at bedtime    . insulin aspart (NOVOLOG FLEXPEN) 100 UNIT/ML injection Inject 4-8 Units into the skin 3 (three) times daily with meals. Sliding scale :cbg 200, gives 4 units. cbg 250 6 units    . insulin NPH-regular Human (NOVOLIN 70/30) (70-30) 100 UNIT/ML injection 12 units every morning & 4 units every evening    . nitroGLYCERIN (NITROSTAT) 0.4 MG SL tablet Place 1 tablet (0.4 mg total) under the tongue every 5 (five) minutes x 3 doses as needed. For chest pain. If no relief after 3rd dose, proceed to the ED for an evaluation 25 tablet 3  . pindolol (VISKEN)  5 MG tablet Take 1 tablet (5 mg total) by mouth 2 (two) times daily. 60 tablet 6  . simvastatin (ZOCOR) 10 MG tablet Take 1 tablet (10 mg total) by mouth at bedtime. 90 tablet 0   No current facility-administered medications for this visit.    Allergies:  Ampicillin   Social History: The patient  reports that she has never smoked. She has never used smokeless tobacco. She reports that she does not drink alcohol or use drugs.   ROS:  Please see the history of present illness. Otherwise, complete review of systems is positive for chronic right arm pain.  All other systems are reviewed and negative.   Physical Exam: VS:   BP (!) 104/58   Pulse 60   Ht  (1.702 m)   Wt 128 lb (58.1 kg)   SpO2 97%   BMI 20.05 kg/m , BMI Body mass index is 20.05 kg/m.  Wt Readings from Last 3 Encounters:  01/16/17 128 lb (58.1 kg)  06/08/16 126 lb (57.2 kg)  11/25/15 120 lb 6.4 oz (54.6 kg)    Chronically ill-appearing woman, no acute distress.  HEENT: Conjunctiva and lids normal, oropharynx clear with poor dentition.  Neck: Supple, no elevated JVP or carotid bruits, no thyromegaly.  Lungs: Clear to auscultation, nonlabored breathing at rest.  Cardiac: Regular rate and rhythm, no S3 or significant systolic murmur, no pericardial rub.  Abdomen: Soft, nontender, bowel sounds present.  Extremities: No pitting edema, distal pulses 2+. Skin: Warm and dry. Musculoskeletal: No kyphosis. Neuropsychiatric: Alert and oriented 3, affect appropriate.  ECG: I personally reviewed the tracing from 06/08/2016 which showed an ectopic atrial rhythm with low voltage and small R' in lead V1 and V2.  Recent Labwork:  July 2016: AST 24, ALT 22, cholesterol 164, triglycerides 177, HDL 80, LDL 69  Other Studies Reviewed Today:  Lexiscan Cardiolite 12/15/2014: FINDINGS: Pharmacological stress  Baseline EKG shows normal sinus rhythm. After injection heart rate increased from 51 beats per min up to 102 beats per min, and blood pressure decreased from 120/69 to 110/68. The test was stopped after injection was complete. The patient did have some chest pain after injection. Post-injection EKG shows inferior downsloping ST depressions of 2 mm and 1 mm downsloping ST depressions in the lateral precordial leads.  Perfusion: There is a small mild intensity fixed basal inferoseptal wall defect. There is normal wall motion of this segment. There is noted radiotracer uptake in the gut adjacent to this wall segment. Findings likely consistent with artifact. There are no other perfusion defects.  Wall Motion: Normal left  ventricular wall motion. No left ventricular dilation.  Left Ventricular Ejection Fraction: 63 %  End diastolic volume 63 ml  End systolic volume 23 ml  IMPRESSION: 1. No reversible ischemia or infarction. Abnormal EKG after Lexiscan injection with transient ST depressions as described above. Imaging is negative for ischemia.  2. Normal left ventricular wall motion.  3. Left ventricular ejection fraction 63%  4. Low-risk stress test findings*.  Assessment and Plan:  1. CAD status post DES to the RCA as outlined above. She has had stable angina symptoms overall on medical therapy and we will continue with observation for now. Stress testing within the last 2 years was low risk.  2. History of ectopic atrial tachycardia. She continues on pindolol reports no palpitations.  3. Hyperlipidemia on Zocor. Keep follow-up with Dr. Sherril Croon.  4. Essential hypertension, blood pressure is well controlled today.  Current medicines were reviewed  with the patient today.   Orders Placed This Encounter  Procedures  . EKG 12-Lead    Disposition: Follow-up in 6 months.  Signed, Jonelle Sidle, MD, [facility] 01/16/2017 2:52 PM    [facility] at [facility] [address] [place], [place], [place] [address] Phone: [phone]; Fax: [phone]

## 2017-01-16 ENCOUNTER — Encounter: Payer: Self-pay | Admitting: Cardiology

## 2017-01-16 ENCOUNTER — Ambulatory Visit (INDEPENDENT_AMBULATORY_CARE_PROVIDER_SITE_OTHER): Payer: Medicaid Other | Admitting: Cardiology

## 2017-01-16 VITALS — BP 104/58 | HR 60 | Ht 67.0 in | Wt 128.0 lb

## 2017-01-16 DIAGNOSIS — I10 Essential (primary) hypertension: Secondary | ICD-10-CM | POA: Diagnosis not present

## 2017-01-16 DIAGNOSIS — I25119 Atherosclerotic heart disease of native coronary artery with unspecified angina pectoris: Secondary | ICD-10-CM

## 2017-01-16 DIAGNOSIS — E782 Mixed hyperlipidemia: Secondary | ICD-10-CM

## 2017-01-16 DIAGNOSIS — I491 Atrial premature depolarization: Secondary | ICD-10-CM

## 2017-01-16 NOTE — Patient Instructions (Signed)

## 2017-03-28 ENCOUNTER — Other Ambulatory Visit: Payer: Self-pay | Admitting: Cardiology

## 2017-03-28 ENCOUNTER — Telehealth: Payer: Self-pay | Admitting: *Deleted

## 2017-03-28 NOTE — Telephone Encounter (Signed)
Called [place] [employer] to see why PA needed again since it was approved on 11/30/16. Per [place] [employer] representative, the pa was only approved for 3 months d/t the length of therapy being 99 years. She stated that a new pa needed to be initiated with length of therapy at 365 days. New PA submitted for pindolol.

## 2017-10-18 NOTE — Progress Notes (Signed)
Cardiology Office Note  Date: 10/22/2017   ID: Haley Seaeresa A Mcelmurry, DOB 05/08/1961, MRN [id number]  PCP: Ignatius SpeckingVyas, Dhruv B, MD  Primary Cardiologist: Nona DellSamuel McDowell, MD   Chief Complaint  Patient presents with  . Coronary Artery Disease    History of Present Illness: Haley [name] is a 57 y.o. female last seen in May 2018.  She presents for a routine follow-up visit.  Since last encounter she has had no chest pain or required nitroglycerin.  Actually, she states that overall she feels well, pain seems to be adequately managed. She continues to [place] in [place], also has a nerve stimulator in place.  I reviewed her current regimen.  Cardiac medications include aspirin, Plavix, pindolol, Zocor, and as needed nitroglycerin.  Past Medical History:  Diagnosis Date  . Anxiety   . Atrial tachycardia (HCC)    Noninducible at EP study  . Chronic pain syndrome   . Coronary atherosclerosis of native coronary artery    DES to mid RCA 01/2007 and DES to mid RCA 01/2009; LVEF normal  . Depression   . Essential hypertension, benign   . GERD (gastroesophageal reflux disease)   . Hyperlipidemia   . Mononeuritis multiplex   . Nephrolithiasis   . Pneumonia   . Pulmonary nodules   . Seizures (HCC)   . Type 2 diabetes mellitus (HCC)     Past Surgical History:  Procedure Laterality Date  . ABDOMINAL HYSTERECTOMY     total  . CHOLECYSTECTOMY    . LEFT HEART CATHETERIZATION WITH CORONARY ANGIOGRAM N/A 12/26/2011   Procedure: LEFT HEART CATHETERIZATION WITH CORONARY ANGIOGRAM;  Surgeon: Tonny BollmanMichael Cooper, MD;  [facility];  Service: Cardiovascular;  Laterality: N/A;  . LEFT HEART CATHETERIZATION WITH CORONARY ANGIOGRAM N/A 01/08/2013   Procedure: LEFT HEART CATHETERIZATION WITH CORONARY ANGIOGRAM;  Surgeon: Wendall StadePeter C Nishan, MD;  [facility];  Service: Cardiovascular;  Laterality: N/A;  . Right knee surgery      Current Outpatient Medications    Medication Sig Dispense Refill  . aspirin EC 81 MG tablet Take 81 mg by mouth daily.    . citalopram (CELEXA) 40 MG tablet Take 40 mg by mouth daily.    . clopidogrel (PLAVIX) 75 MG tablet Take 75 mg by mouth daily.      Marland Kitchen. gabapentin (NEURONTIN) 300 MG capsule Take 300 mg by mouth. Take 2 tablets at 5pm and 3 tablets at bedtime    . insulin aspart (NOVOLOG FLEXPEN) 100 UNIT/ML injection Inject 4-8 Units into the skin 3 (three) times daily with meals. Sliding scale :cbg 200, gives 4 units. cbg 250 6 units    . insulin NPH-regular Human (NOVOLIN 70/30) (70-30) 100 UNIT/ML injection 12 units every morning & 4 units every evening    . methadone (DOLOPHINE) 10 MG tablet Take 10 mg by mouth every 8 (eight) hours.    . nitroGLYCERIN (NITROSTAT) 0.4 MG SL tablet Place 1 tablet (0.4 mg total) under the tongue every 5 (five) minutes x 3 doses as needed. For chest pain. If no relief after 3rd dose, proceed to the ED for an evaluation 25 tablet 3  . pantoprazole (PROTONIX) 40 MG tablet Take 40 mg by mouth daily.    . pindolol (VISKEN) 5 MG tablet TAKE ONE TABLET BY MOUTH TWICE DAILY 60 tablet 6  . simvastatin (ZOCOR) 10 MG tablet Take 1 tablet (10 mg total) by mouth at bedtime. 90 tablet 0   No  current facility-administered medications for this visit.    Allergies:  Ampicillin   Social History: The patient  reports that  has never smoked. she has never used smokeless tobacco. She reports that she does not drink alcohol or use drugs.   ROS:  Please see the history of present illness. Otherwise, complete review of systems is positive for chronic pain..  All other systems are reviewed and negative.   Physical Exam: VS:  BP (!) 100/50   Pulse 60   Ht 5\' 7"  (1.702 m)   Wt 135 lb (61.2 kg)   SpO2 97%   BMI 21.14 kg/m , BMI Body mass index is 21.14 kg/m.  Wt Readings from Last 3 Encounters:  10/22/17 135 lb (61.2 kg)  01/16/17 128 lb (58.1 kg)  06/08/16 126 lb (57.2 kg)    General: Chronically  ill-appearing woman, no distress. HEENT: Conjunctiva and lids normal, oropharynx clear with poor dentition. Neck: Supple, no elevated JVP or carotid bruits, no thyromegaly. Lungs: Clear to auscultation, nonlabored breathing at rest. Cardiac: Regular rate and rhythm, no S3 or significant systolic murmur, no pericardial rub. Abdomen: Soft, nontender, bowel sounds present. Extremities: No pitting edema, distal pulses 2+. Skin: Warm and dry. Musculoskeletal: No kyphosis. Neuropsychiatric: Alert and oriented x3, affect grossly appropriate.  ECG: I personally reviewed the tracing from 01/16/2017 which showed probable sinus rhythm with R' in lead V1 and V2.  Other Studies Reviewed Today:  Lexiscan Cardiolite 12/15/2014: FINDINGS: Pharmacological stress  Baseline EKG shows normal sinus rhythm. After injection heart rate increased from 51 beats per min up to 102 beats per min, and blood pressure decreased from 120/69 to 110/68. The test was stopped after injection was complete. The patient did have some chest pain after injection. Post-injection EKG shows inferior downsloping ST depressions of 2 mm and 1 mm downsloping ST depressions in the lateral precordial leads.  Perfusion: There is a small mild intensity fixed basal inferoseptal wall defect. There is normal wall motion of this segment. There is noted radiotracer uptake in the gut adjacent to this wall segment. Findings likely consistent with artifact. There are no other perfusion defects.  Wall Motion: Normal left ventricular wall motion. No left ventricular dilation.  Left Ventricular Ejection Fraction: 63 %  End diastolic volume 63 ml  End systolic volume 23 ml  Assessment and Plan:  1.  Symptomatically stable CAD with history of DES to the RCA.  Plan to continue medical therapy and observation in the absence of progressing angina.  Refill provided for fresh bottle of nitroglycerin.  2.  Mixed hyperlipidemia, continues  on Zocor with follow-up per Dr. Sherril Croon.  3.  Essential hypertension, blood pressure is low normal today on current regimen.  No changes made.  4.  History of ectopic atrial tachycardia.  She reports no palpitations and is tolerating pindolol.  Current medicines were reviewed with the patient today.   Disposition: Follow-up in 6 months.  Signed, Jonelle Sidle, MD, [facility] 10/22/2017 3:30 PM    [facility] at [facility] [address] [place], [place], [place] [address] Phone: [phone]; Fax: [phone]

## 2017-10-22 ENCOUNTER — Ambulatory Visit (INDEPENDENT_AMBULATORY_CARE_PROVIDER_SITE_OTHER): Payer: Medicaid Other | Admitting: Cardiology

## 2017-10-22 ENCOUNTER — Encounter: Payer: Self-pay | Admitting: Cardiology

## 2017-10-22 VITALS — BP 100/50 | HR 60 | Ht 67.0 in | Wt 135.0 lb

## 2017-10-22 DIAGNOSIS — I25119 Atherosclerotic heart disease of native coronary artery with unspecified angina pectoris: Secondary | ICD-10-CM | POA: Diagnosis not present

## 2017-10-22 DIAGNOSIS — E782 Mixed hyperlipidemia: Secondary | ICD-10-CM

## 2017-10-22 DIAGNOSIS — I10 Essential (primary) hypertension: Secondary | ICD-10-CM | POA: Diagnosis not present

## 2017-10-22 DIAGNOSIS — I491 Atrial premature depolarization: Secondary | ICD-10-CM | POA: Diagnosis not present

## 2017-10-22 MED ORDER — NITROGLYCERIN 0.4 MG SL SUBL: 0.4000 mg | SUBLINGUAL_TABLET | SUBLINGUAL | 3 refills | Status: DC | PRN

## 2017-10-22 NOTE — Addendum Note (Signed)
Addended by: Norva PavlovJOYCE, Zeniya Lapidus on: 10/22/2017 03:40 PM   Modules accepted: Orders

## 2017-10-22 NOTE — Patient Instructions (Signed)

## 2017-12-04 ENCOUNTER — Other Ambulatory Visit: Payer: Self-pay | Admitting: Cardiology

## 2018-03-03 ENCOUNTER — Encounter: Payer: Self-pay | Admitting: Gastroenterology

## 2018-05-29 ENCOUNTER — Ambulatory Visit: Payer: Medicaid Other | Admitting: Gastroenterology

## 2018-09-13 ENCOUNTER — Other Ambulatory Visit: Payer: Self-pay | Admitting: Cardiology

## 2018-10-20 ENCOUNTER — Other Ambulatory Visit: Payer: Self-pay | Admitting: Cardiology

## 2018-11-14 ENCOUNTER — Other Ambulatory Visit: Payer: Self-pay | Admitting: Cardiology

## 2018-11-14 MED ORDER — PINDOLOL 5 MG PO TABS: 5.0000 mg | ORAL_TABLET | Freq: Two times a day (BID) | ORAL | 1 refills | Status: DC

## 2018-11-14 NOTE — Telephone Encounter (Signed)
Done

## 2018-11-14 NOTE — Telephone Encounter (Signed)
° ° °  1. Which medications need to be refilled? (please list name of each medication and dose if known) pindolol (VISKEN) 5 MG tablet    2. Which pharmacy/location (including street and city if local pharmacy) is medication to be sent to?  [facility] [place]    3. Do they need a 30 day or 90 day supply?  Patient is out of medication- has upcoming appointment with Dr. Diona Browner .

## 2018-12-05 ENCOUNTER — Other Ambulatory Visit: Payer: Self-pay | Admitting: Cardiology

## 2018-12-12 ENCOUNTER — Other Ambulatory Visit: Payer: Self-pay | Admitting: Cardiology

## 2018-12-15 ENCOUNTER — Telehealth: Payer: Self-pay | Admitting: *Deleted

## 2018-12-15 ENCOUNTER — Other Ambulatory Visit: Payer: Self-pay | Admitting: Cardiology

## 2018-12-15 NOTE — Telephone Encounter (Signed)
Patient has OV scheduled with Dr. Diona Browner for Wednesday, 12/17/2018 at 10:40.  Multiple attempts made to reach patient.    12/11/2018 - 2:51 - vm not set up  12/16/2018 - 1:54 - vm not set up

## 2018-12-16 NOTE — Telephone Encounter (Signed)
Attempted to reach patient again - VM not set up - no answer

## 2018-12-17 ENCOUNTER — Other Ambulatory Visit: Payer: Self-pay | Admitting: Cardiology

## 2018-12-17 ENCOUNTER — Ambulatory Visit: Payer: Medicaid Other | Admitting: Cardiology

## 2019-01-06 ENCOUNTER — Telehealth: Payer: Self-pay | Admitting: *Deleted

## 2019-01-06 NOTE — Telephone Encounter (Signed)
Medications and allergies reviewed with patient  Unable to check BP, HR and weight prior to visit  No new lab work or hospitalizations since last visit  Patient verbally consented for telehealth visits with [facility] and understands that her insurance company will be billed for the encounter.   

## 2019-01-07 ENCOUNTER — Encounter: Payer: Self-pay | Admitting: Cardiology

## 2019-01-07 NOTE — Progress Notes (Signed)
Virtual Visit via Telephone Note   This visit type was conducted due to national recommendations for restrictions regarding the COVID-19 Pandemic (e.g. social distancing) in an effort to limit this patient's exposure and mitigate transmission in our community.  Due to her co-morbid illnesses, this patient is at least at moderate risk for complications without adequate follow up.  This format is felt to be most appropriate for this patient at this time.  The patient did not have access to video technology/had technical difficulties with video requiring transitioning to audio format only (telephone).  All issues noted in this document were discussed and addressed.  No physical exam could be performed with this format.  Please refer to the patient's chart for her  consent to telehealth for [facility].   Evaluation Performed:  Follow-up visit  Date:  01/08/2019   ID:  Haley [name], DOB 08/05/1961, MRN [id number]  Patient Location: Home Provider Location: Office  PCP:  Ignatius SpeckingVyas, Dhruv B, MD  Cardiologist:  Nona DellSamuel Mardee Clune, MD  Chief Complaint:  Follow-up CAD and symptom control  History of Present Illness:    Haley [name] is a 58 y.o. female last seen in February 2019.  She did not have video access and we spoke by phone today.  She tells me that she feels the best she has in quite some time.  She has been walking outdoors for exercise, reports no angina or nitroglycerin use, NYHA class I-II dyspnea.  We went over her medications which are listed below.  She reports compliance and no obvious intolerances.  She continues to follow with Dr. Sherril CroonVyas and we will plan to obtain her interval lab work for review.  Last ischemic testing was in 2016.  We have been following her on medical therapy in the absence of progressive angina.  We will defer her ECG to next office visit.  The patient does not have symptoms concerning for COVID-19 infection (fever, chills, cough, or new shortness of breath).   She states that she has been social distancing.   Past Medical History:  Diagnosis Date  . Anxiety   . Atrial tachycardia (HCC)    Noninducible at EP study  . Chronic pain syndrome   . Coronary atherosclerosis of native coronary artery    DES to mid RCA 01/2007 and DES to mid RCA 01/2009; LVEF normal  . Depression   . Essential hypertension   . GERD (gastroesophageal reflux disease)   . Hyperlipidemia   . Mononeuritis multiplex   . Nephrolithiasis   . Pneumonia   . Pulmonary nodules   . Seizures (HCC)   . Type 2 diabetes mellitus (HCC)    Past Surgical History:  Procedure Laterality Date  . ABDOMINAL HYSTERECTOMY     total  . CHOLECYSTECTOMY    . LEFT HEART CATHETERIZATION WITH CORONARY ANGIOGRAM N/A 12/26/2011   Procedure: LEFT HEART CATHETERIZATION WITH CORONARY ANGIOGRAM;  Surgeon: Tonny BollmanMichael Cooper, MD;  [facility];  Service: Cardiovascular;  Laterality: N/A;  . LEFT HEART CATHETERIZATION WITH CORONARY ANGIOGRAM N/A 01/08/2013   Procedure: LEFT HEART CATHETERIZATION WITH CORONARY ANGIOGRAM;  Surgeon: Wendall StadePeter C Nishan, MD;  [facility];  Service: Cardiovascular;  Laterality: N/A;  . Right knee surgery       Current Meds  Medication Sig  . aspirin EC 81 MG tablet Take 81 mg by mouth daily.  . citalopram (CELEXA) 40 MG tablet Take 40 mg by mouth daily.  . clopidogrel (PLAVIX) 75 MG tablet Take 75  mg by mouth daily.    . diclofenac (VOLTAREN) 75 MG EC tablet Take 75 mg by mouth 2 (two) times daily.  Marland Kitchen gabapentin (NEURONTIN) 300 MG capsule Take 300 mg by mouth. Take 2 tablets at 5pm and 3 tablets at bedtime  . insulin aspart (NOVOLOG FLEXPEN) 100 UNIT/ML injection Inject 4-8 Units into the skin 3 (three) times daily with meals. Sliding scale :cbg 200, gives 4 units. cbg 250 6 units  . insulin NPH-regular Human (NOVOLIN 70/30) (70-30) 100 UNIT/ML injection 12 units every morning & 4 units every evening  . methadone (DOLOPHINE) 10 MG tablet Take 10 mg by mouth  every 8 (eight) hours.  . nitroGLYCERIN (NITROSTAT) 0.4 MG SL tablet Place 1 tablet (0.4 mg total) under the tongue every 5 (five) minutes x 3 doses as needed. For chest pain. If no relief after 3rd dose, proceed to the ED for an evaluation  . pantoprazole (PROTONIX) 40 MG tablet Take 40 mg by mouth daily.  . pindolol (VISKEN) 5 MG tablet Take 1 tablet by mouth twice daily  . simvastatin (ZOCOR) 10 MG tablet Take 1 tablet (10 mg total) by mouth at bedtime.     Allergies:   Ampicillin   Social History   Tobacco Use  . Smoking status: Never Smoker  . Smokeless tobacco: Never Used  Substance Use Topics  . Alcohol use: No    Alcohol/week: 0.0 standard drinks  . Drug use: No     Family Hx: The patient's family history includes COPD in her father; Heart attack in her mother.  ROS:   Please see the history of present illness.    All other systems reviewed and are negative.   Prior CV studies:   The following studies were reviewed today:  Lexiscan Cardiolite 12/15/2014: FINDINGS: Pharmacological stress  Baseline EKG shows normal sinus rhythm. After injection heart rate increased from 51 beats per min up to 102 beats per min, and blood pressure decreased from 120/69 to 110/68. The test was stopped after injection was complete. The patient did have some chest pain after injection. Post-injection EKG shows inferior downsloping ST depressions of 2 mm and 1 mm downsloping ST depressions in the lateral precordial leads.  Perfusion: There is a small mild intensity fixed basal inferoseptal wall defect. There is normal wall motion of this segment. There is noted radiotracer uptake in the gut adjacent to this wall segment. Findings likely consistent with artifact. There are no other perfusion defects.  Wall Motion: Normal left ventricular wall motion. No left ventricular dilation.  Left Ventricular Ejection Fraction: 63 %  End diastolic volume 63 ml  End systolic volume 23  ml  Labs/Other Tests and Data Reviewed:    EKG:  An ECG dated 01/16/2017 was personally reviewed today and demonstrated:  Probable sinus rhythm with R prime in lead V1 and V2.  Recent Labs:  Requesting from PCP.  Wt Readings from Last 3 Encounters:  10/22/17 135 lb (61.2 kg)  01/16/17 128 lb (58.1 kg)  06/08/16 126 lb (57.2 kg)     Objective:    Vital Signs:  Ht 5\' 7"  (1.702 m)   BMI 21.14 kg/m    Patient could not check vital signs today. She answer questions spontaneously on the phone.  Voice tone and speech cadence were normal.  She was not breathless while speaking in full sentences.  ASSESSMENT & PLAN:    1.  CAD with prior history of DES to the RCA as outlined above.  She  is doing quite well without any active angina symptoms on medical therapy. We will continue with observation.  She is walking for exercise and will continue for now.  Follow-up in 6 months with ECG.  2.  History of ectopic atrial tachycardia.  She has had no palpitations or dizziness.  She continues on pindolol.  3.  Essential hypertension.  Blood pressure was not checked today.  She is seeing Dr. Sherril Croon on a regular basis.  4.  Mixed hyperlipidemia, continues on Zocor.  Requesting interval lab work from Dr. Sherril Croon.  COVID-19 Education: The signs and symptoms of COVID-19 were discussed with the patient and how to seek care for testing (follow up with PCP or arrange E-visit).  The importance of social distancing was discussed today.  Time:   Today, I have spent 6 minutes with the patient with telehealth technology discussing the above problems.     Medication Adjustments/Labs and Tests Ordered: Current medicines are reviewed at length with the patient today.  Concerns regarding medicines are outlined above.   Tests Ordered: No orders of the defined types were placed in this encounter.   Medication Changes: No orders of the defined types were placed in this encounter.   Disposition:  Follow up 6  months in the [place] office.  Signed, Nona Dell, MD  01/08/2019 10:10 AM    [facility]

## 2019-01-08 ENCOUNTER — Encounter: Payer: Self-pay | Admitting: *Deleted

## 2019-01-08 ENCOUNTER — Telehealth (INDEPENDENT_AMBULATORY_CARE_PROVIDER_SITE_OTHER): Payer: Medicaid Other | Admitting: Cardiology

## 2019-01-08 ENCOUNTER — Encounter: Payer: Self-pay | Admitting: Cardiology

## 2019-01-08 VITALS — Ht 67.0 in

## 2019-01-08 DIAGNOSIS — I10 Essential (primary) hypertension: Secondary | ICD-10-CM | POA: Diagnosis not present

## 2019-01-08 DIAGNOSIS — I471 Supraventricular tachycardia, unspecified: Secondary | ICD-10-CM

## 2019-01-08 DIAGNOSIS — E782 Mixed hyperlipidemia: Secondary | ICD-10-CM | POA: Diagnosis not present

## 2019-01-08 DIAGNOSIS — Z7189 Other specified counseling: Secondary | ICD-10-CM | POA: Diagnosis not present

## 2019-01-08 DIAGNOSIS — I25119 Atherosclerotic heart disease of native coronary artery with unspecified angina pectoris: Secondary | ICD-10-CM

## 2019-01-08 NOTE — Patient Instructions (Addendum)
Medication Instructions:   Your physician recommends that you continue on your current medications as directed. Please refer to the Current Medication list given to you today.  Labwork:  NONE  Testing/Procedures:  NONE  Follow-Up:  Your physician recommends that you schedule a follow-up appointment in: 6 months with EKG. You will receive a reminder letter in the mail in about 4 months reminding you to call and schedule your appointment. If you don't receive this letter, please contact our office.  Any Other Special Instructions Will Be Listed Below (If Applicable).  If you need a refill on your cardiac medications before your next appointment, please call your pharmacy. 

## 2019-04-13 ENCOUNTER — Other Ambulatory Visit: Payer: Self-pay | Admitting: *Deleted

## 2019-04-13 MED ORDER — PINDOLOL 5 MG PO TABS: 5.0000 mg | ORAL_TABLET | Freq: Two times a day (BID) | ORAL | 1 refills | Status: DC

## 2019-04-13 NOTE — Telephone Encounter (Signed)
error 

## 2019-06-05 ENCOUNTER — Other Ambulatory Visit: Payer: Self-pay | Admitting: *Deleted

## 2019-06-05 MED ORDER — NITROGLYCERIN 0.4 MG SL SUBL: 0.4000 mg | SUBLINGUAL_TABLET | SUBLINGUAL | 3 refills | Status: DC | PRN

## 2019-07-10 ENCOUNTER — Other Ambulatory Visit: Payer: Self-pay | Admitting: Cardiology

## 2019-07-10 MED ORDER — PINDOLOL 5 MG PO TABS: 5.0000 mg | ORAL_TABLET | Freq: Two times a day (BID) | ORAL | 0 refills | Status: DC

## 2019-07-10 NOTE — Telephone Encounter (Signed)
Medication sent to pharmacy  

## 2019-07-10 NOTE — Telephone Encounter (Signed)
° ° ° °  1. Which medications need to be refilled? (please list name of each medication and dose if known)    pindolol (VISKEN) 5 MG tablet    2. Which pharmacy/location (including street and city if local pharmacy) is medication to be sent to?   [place], [place], [place]  3. Do they need a 30 day or 90 day supply?

## 2019-09-07 ENCOUNTER — Telehealth: Payer: Self-pay | Admitting: Cardiology

## 2019-09-07 NOTE — Telephone Encounter (Signed)
Virtual Visit Pre-Appointment Phone Call  "(Name), I am calling you today to discuss your upcoming appointment. We are currently trying to limit exposure to the virus that causes COVID-19 by seeing patients at home rather than in the office."  1. "What is the BEST phone number to call the day of the visit?" - include this in appointment notes  2. "Do you have or have access to (through a family member/friend) a smartphone with video capability that we can use for your visit?" a. If yes - list this number in appt notes as "cell" (if different from BEST phone #) and list the appointment type as a VIDEO visit in appointment notes b. If no - list the appointment type as a PHONE visit in appointment notes  Confirm consent - "In the setting of the current Covid19 crisis, you are scheduled for a (phone or video) visit with your provider on (date) at (time).  Just as we do with many in-office visits, in order for you to participate in this visit, we must obtain consent.  If you'd like, I can send this to your mychart (if signed up) or email for you to review.  Otherwise, I can obtain your verbal consent now.  All virtual visits are billed to your insurance company just like a normal visit would be.  By agreeing to a virtual visit, we'd like you to understand that the technology does not allow for your provider to perform an examination, and thus may limit your provider's ability to fully assess your condition. If your provider identifies any concerns that need to be evaluated in person, we will make arrangements to do so.  Finally, though the technology is pretty good, we cannot assure that it will always work on either your or our end, and in the setting of a video visit, we may have to convert it to a phone-only visit.  In either situation, we cannot ensure that we have a secure connection.  Are you willing to proceed?" STAFF: Did the patient verbally acknowledge consent to telehealth visit? Document  YES/NO here: yes 3. Advise patient to be prepared - "Two hours prior to your appointment, go ahead and check your blood pressure, pulse, oxygen saturation, and your weight (if you have the equipment to check those) and write them all down. When your visit starts, your provider will ask you for this information. If you have an Apple Watch or Kardia device, please plan to have heart rate information ready on the day of your appointment. Please have a pen and paper handy nearby the day of the visit as well."  4. Give patient instructions for MyChart download to smartphone OR Doximity/Doxy.me as below if video visit (depending on what platform provider is using)  5. Inform patient they will receive a phone call 15 minutes prior to their appointment time (may be from unknown caller ID) so they should be prepared to answer    TELEPHONE CALL NOTE  Haley [name] has been deemed a candidate for a follow-up tele-health visit to limit community exposure during the Covid-19 pandemic. I spoke with the patient via phone to ensure availability of phone/video source, confirm preferred email & phone number, and discuss instructions and expectations.  I reminded Haley [name] to be prepared with any vital sign and/or heart rhythm information that could potentially be obtained via home monitoring, at the time of her visit. I reminded Haley [name] to expect a phone call prior to her visit.  Haley [name] 09/07/2019 11:26 AM   INSTRUCTIONS FOR DOWNLOADING THE MYCHART APP TO SMARTPHONE  - The patient must first make sure to have activated MyChart and know their login information - If Apple, go to [employer] and type in MyChart in the search bar and download the app. If Android, ask patient to go to [employer] and type in [place] in the search bar and download the app. The app is free but as with any other app downloads, their phone may require them to verify saved payment information or Apple/Android  password.  - The patient will need to then log into the app with their MyChart username and password, and select [facility] as their healthcare provider to link the account. When it is time for your visit, go to the MyChart app, find appointments, and click Begin Video Visit. Be sure to Select Allow for your device to access the Microphone and Camera for your visit. You will then be connected, and your provider will be with you shortly.  **If they have any issues connecting, or need assistance please contact MyChart service desk [phone])**  **If using a computer, in order to ensure the best quality for their visit they will need to use either of the following Internet Browsers: [employer], or Google Chrome**  IF USING DOXIMITY or DOXY.ME - The patient will receive a link just prior to their visit by text.     FULL LENGTH CONSENT FOR TELE-HEALTH VISIT   I hereby voluntarily request, consent and authorize [facility] and its employed or contracted physicians, physician assistants, nurse practitioners or other licensed health care professionals (the Practitioner), to provide me with telemedicine health care services (the "Services") as deemed necessary by the treating Practitioner. I acknowledge and consent to receive the Services by the Practitioner via telemedicine. I understand that the telemedicine visit will involve communicating with the Practitioner through live audiovisual communication technology and the disclosure of certain medical information by electronic transmission. I acknowledge that I have been given the opportunity to request an in-person assessment or other available alternative prior to the telemedicine visit and am voluntarily participating in the telemedicine visit.  I understand that I have the right to withhold or withdraw my consent to the use of telemedicine in the course of my care at any time, without affecting my right to future care or treatment,  and that the Practitioner or I may terminate the telemedicine visit at any time. I understand that I have the right to inspect all information obtained and/or recorded in the course of the telemedicine visit and may receive copies of available information for a reasonable fee.  I understand that some of the potential risks of receiving the Services via telemedicine include:  Marland Kitchen Delay or interruption in medical evaluation due to technological equipment failure or disruption; . Information transmitted may not be sufficient (e.g. poor resolution of images) to allow for appropriate medical decision making by the Practitioner; and/or  . In rare instances, security protocols could fail, causing a breach of personal health information.  Furthermore, I acknowledge that it is my responsibility to provide information about my medical history, conditions and care that is complete and accurate to the best of my ability. I acknowledge that Practitioner's advice, recommendations, and/or decision may be based on factors not within their control, such as incomplete or inaccurate data provided by me or distortions of diagnostic images or specimens that may result from electronic transmissions. I understand that the  practice of medicine is not an Chief Strategy Officer and that Practitioner makes no warranties or guarantees regarding treatment outcomes. I acknowledge that I will receive a copy of this consent concurrently upon execution via email to the email address I last provided but may also request a printed copy by calling the office of [place].    I understand that my insurance will be billed for this visit.   I have read or had this consent read to me. . I understand the contents of this consent, which adequately explains the benefits and risks of the Services being provided via telemedicine.  . I have been provided ample opportunity to ask questions regarding this consent and the Services and have had my questions  answered to my satisfaction. . I give my informed consent for the services to be provided through the use of telemedicine in my medical care  By participating in this telemedicine visit I agree to the above.

## 2019-09-16 ENCOUNTER — Telehealth (INDEPENDENT_AMBULATORY_CARE_PROVIDER_SITE_OTHER): Payer: Medicaid Other | Admitting: Cardiology

## 2019-09-16 ENCOUNTER — Encounter: Payer: Self-pay | Admitting: Cardiology

## 2019-09-16 VITALS — Ht 67.0 in | Wt 139.0 lb

## 2019-09-16 DIAGNOSIS — E782 Mixed hyperlipidemia: Secondary | ICD-10-CM

## 2019-09-16 DIAGNOSIS — I25119 Atherosclerotic heart disease of native coronary artery with unspecified angina pectoris: Secondary | ICD-10-CM

## 2019-09-16 DIAGNOSIS — I471 Supraventricular tachycardia: Secondary | ICD-10-CM | POA: Diagnosis not present

## 2019-09-16 DIAGNOSIS — I10 Essential (primary) hypertension: Secondary | ICD-10-CM

## 2019-09-16 NOTE — Patient Instructions (Signed)

## 2019-09-16 NOTE — Progress Notes (Signed)
Virtual Visit via Telephone Note   This visit type was conducted due to national recommendations for restrictions regarding the COVID-19 Pandemic (e.g. social distancing) in an effort to limit this patient's exposure and mitigate transmission in our community.  Due to her co-morbid illnesses, this patient is at least at moderate risk for complications without adequate follow up.  This format is felt to be most appropriate for this patient at this time.  The patient did not have access to video technology/had technical difficulties with video requiring transitioning to audio format only (telephone).  All issues noted in this document were discussed and addressed.  No physical exam could be performed with this format.  Please refer to the patient's chart for her  consent to telehealth for [facility].   Date:  09/16/2019   ID:  Haley Seaeresa A Chhim, DOB 07/17/1961, MRN [id number]  Patient Location: Home Provider Location: Office  PCP:  Ignatius SpeckingVyas, Dhruv B, MD  Cardiologist:  Nona DellSamuel Rayder Sullenger, MD Electrophysiologist:  None   Evaluation Performed:  Follow-Up Visit  Chief Complaint:  Cardiac follow-up  History of Present Illness:    Haley [name] is a 58 y.o. female last assessed via telehealth encounter in April.  We spoke by phone today.  She states that she was diagnosed with COVID-19 back in October, symptomatic for a few weeks, and actually just now getting back her full sense of taste and smell.  She otherwise has felt well, reports no angina symptoms, palpitations, or syncope.  She has follow-up with Dr. Sherril CroonVyas in January.  I asked her to make sure that she asked about her most recent lipid panel.  She continues on Zocor.  We went over the remainder of her cardiac regimen which is stable and outlined below.  She has a fresh bottle of nitroglycerin, has not used it.   Past Medical History:  Diagnosis Date  . Anxiety   . Atrial tachycardia (HCC)    Noninducible at EP study  . Chronic pain  syndrome   . Coronary atherosclerosis of native coronary artery    DES to mid RCA 01/2007 and DES to mid RCA 01/2009; LVEF normal  . Depression   . Essential hypertension   . GERD (gastroesophageal reflux disease)   . Hyperlipidemia   . Mononeuritis multiplex   . Nephrolithiasis   . Pneumonia   . Pulmonary nodules   . Seizures (HCC)   . Type 2 diabetes mellitus (HCC)    Past Surgical History:  Procedure Laterality Date  . ABDOMINAL HYSTERECTOMY     total  . CHOLECYSTECTOMY    . LEFT HEART CATHETERIZATION WITH CORONARY ANGIOGRAM N/A 12/26/2011   Procedure: LEFT HEART CATHETERIZATION WITH CORONARY ANGIOGRAM;  Surgeon: Tonny BollmanMichael Cooper, MD;  [facility];  Service: Cardiovascular;  Laterality: N/A;  . LEFT HEART CATHETERIZATION WITH CORONARY ANGIOGRAM N/A 01/08/2013   Procedure: LEFT HEART CATHETERIZATION WITH CORONARY ANGIOGRAM;  Surgeon: Wendall StadePeter C Nishan, MD;  [facility];  Service: Cardiovascular;  Laterality: N/A;  . Right knee surgery       Current Meds  Medication Sig  . aspirin EC 81 MG tablet Take 81 mg by mouth daily.  Marland Kitchen. BLACK COHOSH PO Take 1 tablet by mouth 2 (two) times daily. 40mg   . citalopram (CELEXA) 20 MG tablet Take 20 mg by mouth daily.  . clopidogrel (PLAVIX) 75 MG tablet Take 75 mg by mouth daily.    Janann August. EUTHYROX 25 MCG tablet TAKE 1 TABLET BY MOUTH ONCE DAILY  IN THE MORNING  . gabapentin (NEURONTIN) 300 MG capsule Take 900 mg by mouth at bedtime.   . insulin aspart (NOVOLOG FLEXPEN) 100 UNIT/ML injection Inject 4-8 Units into the skin 3 (three) times daily with meals. Sliding scale :cbg 200, gives 4 units. cbg 250 6 units  . insulin NPH-regular Human (NOVOLIN 70/30) (70-30) 100 UNIT/ML injection 12 units every morning & 4 units every evening  . methadone (DOLOPHINE) 10 MG tablet Take 10 mg by mouth every 8 (eight) hours.  . nitroGLYCERIN (NITROSTAT) 0.4 MG SL tablet Place 1 tablet (0.4 mg total) under the tongue every 5 (five) minutes x 3 doses as  needed. For chest pain. If no relief after 3rd dose, proceed to the ED for an evaluation  . pantoprazole (PROTONIX) 40 MG tablet Take 40 mg by mouth daily.  . pindolol (VISKEN) 5 MG tablet Take 1 tablet (5 mg total) by mouth 2 (two) times daily.  . simvastatin (ZOCOR) 10 MG tablet Take 10 mg by mouth daily.      Allergies:   Ampicillin   Social History   Tobacco Use  . Smoking status: Never Smoker  . Smokeless tobacco: Never Used  Substance Use Topics  . Alcohol use: No    Alcohol/week: 0.0 standard drinks  . Drug use: No     Family Hx: The patient's family history includes COPD in her father; Heart attack in her mother.  ROS:   Please see the history of present illness. All other systems reviewed and are negative.   Prior CV studies:   The following studies were reviewed today:  Lexiscan Cardiolite 12/15/2014: FINDINGS: Pharmacological stress  Baseline EKG shows normal sinus rhythm. After injection heart rate increased from 51 beats per min up to 102 beats per min, and blood pressure decreased from 120/69 to 110/68. The test was stopped after injection was complete. The patient did have some chest pain after injection. Post-injection EKG shows inferior downsloping ST depressions of 2 mm and 1 mm downsloping ST depressions in the lateral precordial leads.  Perfusion: There is a small mild intensity fixed basal inferoseptal wall defect. There is normal wall motion of this segment. There is noted radiotracer uptake in the gut adjacent to this wall segment. Findings likely consistent with artifact. There are no other perfusion defects.  Wall Motion: Normal left ventricular wall motion. No left ventricular dilation.  Left Ventricular Ejection Fraction: 63 %  End diastolic volume 63 ml  End systolic volume 23 ml  Labs/Other Tests and Data Reviewed:    EKG:  An ECG dated 01/16/2017 was personally reviewed today and demonstrated:  Probable sinus rhythm with R'  in lead V1 and V2.  Recent Labs:  April 2020: Hemoglobin A1c 7.1%, hemoglobin 13.7, platelets 228  Wt Readings from Last 3 Encounters:  09/16/19 139 lb (63 kg)  10/22/17 135 lb (61.2 kg)  01/16/17 128 lb (58.1 kg)     Objective:    Vital Signs:  Ht 5\' 7"  (1.702 m)   Wt 139 lb (63 kg)   BMI 21.77 kg/m    She did not have a way to check vital signs today. Patient spoke in full sentences, not short of breath. No audible wheezing or coughing. Speech pattern normal.  ASSESSMENT & PLAN:    1.  CAD status post DES to the RCA in 2008 and again in 2010.  She reports no active angina symptoms and continues on medical therapy including aspirin, Plavix, Zocor, pindolol, and as needed nitroglycerin.  Last ischemic evaluation was in 2016.  We will schedule follow-up visit in 6 months in the office with ECG.  2.  Mixed hyperlipidemia, continues on Zocor.  I asked her to follow through with Dr. Sherril Croon regarding a lipid panel.  Ideally LDL should be 70 or less.  3.  Essential hypertension, no changes made to present regimen.  4.  History of ectopic atrial tachycardia.  She reports no active sense of palpitations.  COVID-19 Education: The signs and symptoms of COVID-19 were discussed with the patient and how to seek care for testing (follow up with PCP or arrange E-visit).  The importance of social distancing was discussed today.  Time:   Today, I have spent 6 minutes with the patient with telehealth technology discussing the above problems.     Medication Adjustments/Labs and Tests Ordered: Current medicines are reviewed at length with the patient today.  Concerns regarding medicines are outlined above.   Tests Ordered: No orders of the defined types were placed in this encounter.   Medication Changes: No orders of the defined types were placed in this encounter.   Follow Up:  In Person 6 months in the [place] office.   Signed, Nona Dell, MD  09/16/2019 10:09 AM    [facility]

## 2020-03-17 ENCOUNTER — Ambulatory Visit (INDEPENDENT_AMBULATORY_CARE_PROVIDER_SITE_OTHER): Payer: Medicaid Other | Admitting: Cardiology

## 2020-03-17 ENCOUNTER — Encounter: Payer: Self-pay | Admitting: Cardiology

## 2020-03-17 ENCOUNTER — Other Ambulatory Visit: Payer: Self-pay

## 2020-03-17 VITALS — BP 102/60 | HR 61 | Ht 67.0 in | Wt 134.0 lb

## 2020-03-17 DIAGNOSIS — I471 Supraventricular tachycardia: Secondary | ICD-10-CM

## 2020-03-17 DIAGNOSIS — I25119 Atherosclerotic heart disease of native coronary artery with unspecified angina pectoris: Secondary | ICD-10-CM

## 2020-03-17 DIAGNOSIS — E782 Mixed hyperlipidemia: Secondary | ICD-10-CM

## 2020-03-17 NOTE — Progress Notes (Signed)
Cardiology Office Note  Date: 03/17/2020   ID: NATALLIA STELLMACH, DOB 07/08/1961, MRN [id number]  PCP:  Ignatius Specking, MD  Cardiologist:  Nona Dell, MD Electrophysiologist:  None   Chief Complaint  Patient presents with  . Cardiac follow-up    History of Present Illness: Haley [name] is a 59 y.o. female last assessed via telehealth encounter in December 2020.  She presents for a routine visit.  From a cardiac perspective, she does not report any active angina at this time, no recent nitroglycerin use.  She has been walking with her husband for exercise.  Reports NYHA class II dyspnea, no palpitations or syncope.  I reviewed her medications which are outlined below and stable from a cardiac perspective.  She does not report any intolerances.  I personally reviewed her ECG today which shows sinus bradycardia with possible left atrial enlargement.  Past Medical History:  Diagnosis Date  . Anxiety   . Atrial tachycardia (HCC)    Noninducible at EP study  . Chronic pain syndrome   . Coronary atherosclerosis of native coronary artery    DES to mid RCA 01/2007 and DES to mid RCA 01/2009; LVEF normal  . Depression   . Essential hypertension   . GERD (gastroesophageal reflux disease)   . Hyperlipidemia   . Mononeuritis multiplex   . Nephrolithiasis   . Pneumonia   . Pulmonary nodules   . Seizures (HCC)   . Type 2 diabetes mellitus (HCC)     Past Surgical History:  Procedure Laterality Date  . ABDOMINAL HYSTERECTOMY     total  . CHOLECYSTECTOMY    . LEFT HEART CATHETERIZATION WITH CORONARY ANGIOGRAM N/A 12/26/2011   Procedure: LEFT HEART CATHETERIZATION WITH CORONARY ANGIOGRAM;  Surgeon: Tonny Bollman, MD;  Location: [facility] CATH LAB;  Service: Cardiovascular;  Laterality: N/A;  . LEFT HEART CATHETERIZATION WITH CORONARY ANGIOGRAM N/A 01/08/2013   Procedure: LEFT HEART CATHETERIZATION WITH CORONARY ANGIOGRAM;  Surgeon: Wendall Stade, MD;  Location: [facility] CATH LAB;  Service:  Cardiovascular;  Laterality: N/A;  . Right knee surgery      Current Outpatient Medications  Medication Sig Dispense Refill  . aspirin EC 81 MG tablet Take 81 mg by mouth daily.    Marland Kitchen BLACK COHOSH PO Take 1 tablet by mouth 2 (two) times daily. 40mg     . citalopram (CELEXA) 20 MG tablet Take 20 mg by mouth daily.    . clopidogrel (PLAVIX) 75 MG tablet Take 75 mg by mouth daily.      25 MCG tablet TAKE 1 TABLET BY MOUTH ONCE DAILY IN THE MORNING    . gabapentin (NEURONTIN) 300 MG capsule Take 900 mg by mouth at bedtime.     . insulin aspart (NOVOLOG FLEXPEN) 100 UNIT/ML injection Inject 4-8 Units into the skin 3 (three) times daily with meals. Sliding scale :cbg 200, gives 4 units. cbg 250 6 units    . insulin NPH-regular Human (NOVOLIN 70/30) (70-30) 100 UNIT/ML injection 12 units every morning & 4 units every evening    . methadone (DOLOPHINE) 10 MG tablet Take 10 mg by mouth every 8 (eight) hours.    . nitroGLYCERIN (NITROSTAT) 0.4 MG SL tablet Place 1 tablet (0.4 mg total) under the tongue every 5 (five) minutes x 3 doses as needed. For chest pain. If no relief after 3rd dose, proceed to the ED for an evaluation 25 tablet 3  . pantoprazole (PROTONIX) 40 MG tablet Take 40 mg by  mouth daily.    . pindolol (VISKEN) 5 MG tablet Take 1 tablet (5 mg total) by mouth 2 (two) times daily. 180 tablet 0  . simvastatin (ZOCOR) 10 MG tablet Take 10 mg by mouth daily.  90 tablet 0   No current facility-administered medications for this visit.   Allergies:  Ampicillin   ROS:   No sudden palpitations or syncope.  Physical Exam: VS:  BP 102/60   Pulse 61   Ht 5\' 7"  (1.702 m)   Wt 134 lb (60.8 kg)   SpO2 95%   BMI 20.99 kg/m , BMI Body mass index is 20.99 kg/m.  Wt Readings from Last 3 Encounters:  03/17/20 134 lb (60.8 kg)  09/16/19 139 lb (63 kg)  10/22/17 135 lb (61.2 kg)    General: Patient appears comfortable at rest. HEENT: Conjunctiva and lids normal, wearing a mask. Neck:  Supple, no elevated JVP or carotid bruits, no thyromegaly. Lungs: Clear to auscultation, nonlabored breathing at rest. Cardiac: Regular rate and rhythm, no S3 or significant systolic murmur. Extremities: No pitting edema, distal pulses 2+.  ECG:  An ECG dated 01/16/2017 was personally reviewed today and demonstrated:  Probable sinus rhythm with R' in lead V1 and V2.  Recent Labwork:  April 2020: Hemoglobin A1c 7.1%, hemoglobin 13.7, platelets 228  Other Studies Reviewed Today:  Lexiscan Cardiolite 12/15/2014: FINDINGS: Pharmacological stress  Baseline EKG shows normal sinus rhythm. After injection heart rate increased from 51 beats per min up to 102 beats per min, and blood pressure decreased from 120/69 to 110/68. The test was stopped after injection was complete. The patient did have some chest pain after injection. Post-injection EKG shows inferior downsloping ST depressions of 2 mm and 1 mm downsloping ST depressions in the lateral precordial leads.  Perfusion: There is a small mild intensity fixed basal inferoseptal wall defect. There is normal wall motion of this segment. There is noted radiotracer uptake in the gut adjacent to this wall segment. Findings likely consistent with artifact. There are no other perfusion defects.  Wall Motion: Normal left ventricular wall motion. No left ventricular dilation.  Left Ventricular Ejection Fraction: 63 %  End diastolic volume 63 ml  End systolic volume 23 ml  Assessment and Plan:  1.  CAD status post DES to the RCA in 2008 as well as 2010.  She does not report any angina at this time.  ECG reviewed and stable.  Continue aspirin, Plavix, pindolol, Zocor, and as needed nitroglycerin.  I encouraged her to get a refill for a fresh bottle.  2.  Essential hypertension, blood pressure is well controlled today.  No changes in current regimen.  3.  History of ectopic atrial tachycardia.  No active palpitations or  syncope.  Medication Adjustments/Labs and Tests Ordered: Current medicines are reviewed at length with the patient today.  Concerns regarding medicines are outlined above.   Tests Ordered: Orders Placed This Encounter  Procedures  . EKG 12-Lead    Medication Changes: No orders of the defined types were placed in this encounter.   Disposition:  Follow up 6 months in the [place] office.  Signed, [place], MD, [facility] 03/17/2020 12:00 PM    [facility] at [facility] [address] [place], [place], [place] Phone: [phone]; Fax: [phone]

## 2020-03-17 NOTE — Patient Instructions (Addendum)

## 2020-07-27 ENCOUNTER — Other Ambulatory Visit: Payer: Self-pay | Admitting: Cardiology

## 2020-10-25 ENCOUNTER — Other Ambulatory Visit: Payer: Self-pay | Admitting: Cardiology

## 2020-12-19 ENCOUNTER — Encounter: Payer: Self-pay | Admitting: Cardiology

## 2020-12-19 ENCOUNTER — Encounter: Payer: Self-pay | Admitting: *Deleted

## 2020-12-19 ENCOUNTER — Encounter (INDEPENDENT_AMBULATORY_CARE_PROVIDER_SITE_OTHER): Payer: Self-pay

## 2020-12-19 ENCOUNTER — Ambulatory Visit (INDEPENDENT_AMBULATORY_CARE_PROVIDER_SITE_OTHER): Payer: Medicaid Other | Admitting: Cardiology

## 2020-12-19 ENCOUNTER — Other Ambulatory Visit: Payer: Self-pay

## 2020-12-19 VITALS — BP 104/60 | HR 65 | Ht 67.0 in | Wt 139.8 lb

## 2020-12-19 DIAGNOSIS — I471 Supraventricular tachycardia: Secondary | ICD-10-CM

## 2020-12-19 DIAGNOSIS — I25119 Atherosclerotic heart disease of native coronary artery with unspecified angina pectoris: Secondary | ICD-10-CM

## 2020-12-19 NOTE — Patient Instructions (Signed)
Your physician recommends that you schedule a follow-up appointment in: 6 MONTHS WITH DR MCDOWELL  Your physician recommends that you continue on your current medications as directed. Please refer to the Current Medication list given to you today.  Thank you for choosing [facility]!!    

## 2020-12-19 NOTE — Progress Notes (Signed)
Cardiology Office Note  Date: 12/19/2020   ID: Haley [name], DOB 01-29-1961, MRN [id number]  PCP:  Ignatius Specking, MD  Cardiologist:  Nona Dell, MD Electrophysiologist:  None   Chief Complaint  Patient presents with  . Cardiac follow-up    History of Present Illness: Haley [name] is a 59 y.o. female last seen in September 2021.  She presents for a routine visit.  Reports no active angina symptoms or palpitations.  Still enjoys walking for exercise, usually 4 to 5 days a week when the weather allows.  I reviewed her medications which are stable from a cardiac perspective and outlined below.  We are requesting her most recent lab work from Dr. Sherril Croon.  She has not required any nitroglycerin.  No reported intolerances to Zocor.   Past Medical History:  Diagnosis Date  . Anxiety   . Atrial tachycardia (HCC)    Noninducible at EP study  . Chronic pain syndrome   . Coronary atherosclerosis of native coronary artery    DES to mid RCA 01/2007 and DES to mid RCA 01/2009; LVEF normal  . Depression   . Essential hypertension   . GERD (gastroesophageal reflux disease)   . Hyperlipidemia   . Mononeuritis multiplex   . Nephrolithiasis   . Pneumonia   . Pulmonary nodules   . Seizures (HCC)   . Type 2 diabetes mellitus (HCC)     Past Surgical History:  Procedure Laterality Date  . ABDOMINAL HYSTERECTOMY     total  . CHOLECYSTECTOMY    . LEFT HEART CATHETERIZATION WITH CORONARY ANGIOGRAM N/A 12/26/2011   Procedure: LEFT HEART CATHETERIZATION WITH CORONARY ANGIOGRAM;  Surgeon: Tonny Bollman, MD;  Location: [facility] CATH LAB;  Service: Cardiovascular;  Laterality: N/A;  . LEFT HEART CATHETERIZATION WITH CORONARY ANGIOGRAM N/A 01/08/2013   Procedure: LEFT HEART CATHETERIZATION WITH CORONARY ANGIOGRAM;  Surgeon: Wendall Stade, MD;  Location: [facility] LAB;  Service: Cardiovascular;  Laterality: N/A;  . Right knee surgery      Current Outpatient Medications  Medication Sig Dispense  Refill  . aspirin EC 81 MG tablet Take 81 mg by mouth daily.    Marland Kitchen BLACK COHOSH PO Take 1 tablet by mouth 2 (two) times daily. 40mg     . citalopram (CELEXA) 20 MG tablet Take 20 mg by mouth daily.    . clopidogrel (PLAVIX) 75 MG tablet Take 75 mg by mouth daily.    25 MCG tablet TAKE 1 TABLET BY MOUTH ONCE DAILY IN THE MORNING    . gabapentin (NEURONTIN) 300 MG capsule Take 900 mg by mouth at bedtime.     . insulin aspart (NOVOLOG) 100 UNIT/ML injection Inject 4-8 Units into the skin 3 (three) times daily with meals. Sliding scale :cbg 200, gives 4 units. cbg 250 6 units    . insulin NPH-regular Human (NOVOLIN 70/30) (70-30) 100 UNIT/ML injection 12 units every morning & 4 units every evening    . methadone (DOLOPHINE) 10 MG tablet Take 10 mg by mouth every 8 (eight) hours.    . nitroGLYCERIN (NITROSTAT) 0.4 MG SL tablet Place 1 tablet (0.4 mg total) under the tongue every 5 (five) minutes x 3 doses as needed. For chest pain. If no relief after 3rd dose, proceed to the ED for an evaluation 25 tablet 3  . pantoprazole (PROTONIX) 40 MG tablet Take 40 mg by mouth daily.    . pindolol (VISKEN) 5 MG tablet Take 1 tablet by mouth twice  daily 180 tablet 3  . raloxifene (EVISTA) 60 MG tablet Take 80 mg by mouth daily.    . simvastatin (ZOCOR) 10 MG tablet Take 10 mg by mouth daily.  90 tablet 0   No current facility-administered medications for this visit.   Allergies:  Ampicillin   ROS: No palpitations or syncope  Physical Exam: VS:  BP 104/60   Pulse 65   Ht 5\' 7"  (1.702 m)   Wt 139 lb 12.8 oz (63.4 kg)   SpO2 95%   BMI 21.90 kg/m , BMI Body mass index is 21.9 kg/m.  Wt Readings from Last 3 Encounters:  12/19/20 139 lb 12.8 oz (63.4 kg)  03/17/20 134 lb (60.8 kg)  09/16/19 139 lb (63 kg)    General: Patient appears comfortable at rest. HEENT: Conjunctiva and lids normal, wearing a mask. Neck: Supple, no elevated JVP or carotid bruits, no thyromegaly. Lungs: Clear to  auscultation, nonlabored breathing at rest. Cardiac: Regular rate and rhythm, no S3 or significant systolic murmur, no pericardial rub. Extremities: No pitting edema, distal pulses 2+.  ECG:  An ECG dated 03/17/2020 was personally reviewed today and demonstrated:  Sinus bradycardia with possible left atrial enlargement.  Recent Labwork:  No interval lab work for review today.  Other Studies Reviewed Today:  Lexiscan Cardiolite 12/15/2014: FINDINGS: Pharmacological stress  Baseline EKG shows normal sinus rhythm. After injection heart rate increased from 51 beats per min up to 102 beats per min, and blood pressure decreased from 120/69 to 110/68. The test was stopped after injection was complete. The patient did have some chest pain after injection. Post-injection EKG shows inferior downsloping ST depressions of 2 mm and 1 mm downsloping ST depressions in the lateral precordial leads.  Perfusion: There is a small mild intensity fixed basal inferoseptal wall defect. There is normal wall motion of this segment. There is noted radiotracer uptake in the gut adjacent to this wall segment. Findings likely consistent with artifact. There are no other perfusion defects.  Wall Motion: Normal left ventricular wall motion. No left ventricular dilation.  Left Ventricular Ejection Fraction: 63 %  End diastolic volume 63 ml  End systolic volume 23 ml  Assessment and Plan:  1.  CAD status post DES to the RCA in 2008 and in 2010.  Stable at this point without active angina on medical therapy.  Continue aspirin, Plavix, pindolol, Zocor, and has any nitroglycerin.  Requesting interval lab work from Dr. 2011.  2.  Ectopic atrial tachycardia, currently quiescent , no active palpitations.  Medication Adjustments/Labs and Tests Ordered: Current medicines are reviewed at length with the patient today.  Concerns regarding medicines are outlined above.   Tests Ordered: No orders of the  defined types were placed in this encounter.   Medication Changes: No orders of the defined types were placed in this encounter.   Disposition:  Follow up 6 months in the [place] office.  Signed, [place], MD, [facility] 12/19/2020 1:29 PM    [facility] at [facility] [address] [place], [place], [place] Phone: [phone]; Fax: [phone]

## 2021-07-28 ENCOUNTER — Other Ambulatory Visit: Payer: Self-pay | Admitting: Internal Medicine

## 2021-07-28 DIAGNOSIS — Z139 Encounter for screening, unspecified: Secondary | ICD-10-CM

## 2021-08-09 ENCOUNTER — Ambulatory Visit
Admission: RE | Admit: 2021-08-09 | Discharge: 2021-08-09 | Disposition: A | Discharge status: Home / Self Care | Payer: Medicaid Other | Source: Ambulatory Visit | Attending: Internal Medicine | Admitting: Internal Medicine

## 2021-08-09 ENCOUNTER — Other Ambulatory Visit: Payer: Self-pay

## 2021-08-09 DIAGNOSIS — Z139 Encounter for screening, unspecified: Secondary | ICD-10-CM

## 2021-10-13 ENCOUNTER — Other Ambulatory Visit: Payer: Self-pay | Admitting: Cardiology

## 2021-11-23 ENCOUNTER — Telehealth: Payer: Self-pay | Admitting: Cardiology

## 2021-11-23 MED ORDER — PINDOLOL 5 MG PO TABS: 5.0000 mg | ORAL_TABLET | Freq: Two times a day (BID) | ORAL | 1 refills | Status: DC

## 2021-11-23 NOTE — Telephone Encounter (Signed)
?*  STAT* If patient is at the pharmacy, call can be transferred to refill team. ? ? ?1. Which medications need to be refilled? (please list name of each medication and dose if known)  ?pindolol (VISKEN) 5 MG tablet ? ?2. Which pharmacy/location (including street and city if local pharmacy) is medication to be sent to? ?[facility] [address] [place] - [address] ? ?3. Do they need a 30 day or 90 day supply? 90 with refills  ? ?Patient is scheduled to see Dr. Diona Browner 01/31/22 ?

## 2021-11-23 NOTE — Telephone Encounter (Signed)
Done

## 2022-01-31 ENCOUNTER — Ambulatory Visit (INDEPENDENT_AMBULATORY_CARE_PROVIDER_SITE_OTHER): Payer: Medicaid Other | Admitting: Cardiology

## 2022-01-31 ENCOUNTER — Encounter: Payer: Self-pay | Admitting: *Deleted

## 2022-01-31 ENCOUNTER — Encounter: Payer: Self-pay | Admitting: Cardiology

## 2022-01-31 VITALS — BP 148/70 | HR 65 | Ht 67.0 in | Wt 127.8 lb

## 2022-01-31 DIAGNOSIS — I471 Supraventricular tachycardia: Secondary | ICD-10-CM

## 2022-01-31 DIAGNOSIS — I25119 Atherosclerotic heart disease of native coronary artery with unspecified angina pectoris: Secondary | ICD-10-CM | POA: Diagnosis not present

## 2022-01-31 DIAGNOSIS — E782 Mixed hyperlipidemia: Secondary | ICD-10-CM | POA: Diagnosis not present

## 2022-01-31 MED ORDER — NITROGLYCERIN 0.4 MG SL SUBL: 0.4000 mg | SUBLINGUAL_TABLET | SUBLINGUAL | 3 refills | Status: DC | PRN

## 2022-01-31 NOTE — Patient Instructions (Addendum)

## 2022-01-31 NOTE — Progress Notes (Signed)
? ? ?Cardiology Office Note ? ?Date: 01/31/2022  ? ?ID: Haley [name], DOB Mar 04, 1961, MRN [id number] ? ?PCP:  Ignatius Specking, MD  ?Cardiologist:  Nona Dell, MD ?Electrophysiologist:  None  ? ?Chief Complaint  ?Patient presents with  ? Cardiac follow-up  ? ? ?History of Present Illness: ?Haley [name] is a 61 y.o. female last seen in April 2022.  She is here for a routine follow-up visit.  From a cardiac perspective she does not describe any angina symptoms or nitroglycerin use.  She walks 2 miles at a time, on average 3 days a week.  NYHA class II dyspnea.  No palpitations or syncope. ? ?I reviewed her medications which are outlined below.  We are requesting most recent lab work from PCP.  Her lipids have generally been well controlled over time.  She does need a prescription for a fresh bottle of nitroglycerin. ? ?I personally reviewed her ECG today which shows normal sinus rhythm. ? ?Past Medical History:  ?Diagnosis Date  ? Anxiety   ? Atrial tachycardia (HCC)   ? Noninducible at EP study  ? Chronic pain syndrome   ? Coronary atherosclerosis of native coronary artery   ? DES to mid RCA 01/2007 and DES to mid RCA 01/2009; LVEF normal  ? Depression   ? Essential hypertension   ? GERD (gastroesophageal reflux disease)   ? Hyperlipidemia   ? Mononeuritis multiplex   ? Nephrolithiasis   ? Pneumonia   ? Pulmonary nodules   ? Seizures (HCC)   ? Type 2 diabetes mellitus (HCC)   ? ? ?Past Surgical History:  ?Procedure Laterality Date  ? ABDOMINAL HYSTERECTOMY    ? total  ? CHOLECYSTECTOMY    ? LEFT HEART CATHETERIZATION WITH CORONARY ANGIOGRAM N/A 12/26/2011  ? Procedure: LEFT HEART CATHETERIZATION WITH CORONARY ANGIOGRAM;  Surgeon: Tonny Bollman, MD;  Location: [facility] CATH LAB;  Service: Cardiovascular;  Laterality: N/A;  ? LEFT HEART CATHETERIZATION WITH CORONARY ANGIOGRAM N/A 01/08/2013  ? Procedure: LEFT HEART CATHETERIZATION WITH CORONARY ANGIOGRAM;  Surgeon: Wendall Stade, MD;  Location: [facility] CATH LAB;  Service:  Cardiovascular;  Laterality: N/A;  ? Right knee surgery    ? ? ?Current Outpatient Medications  ?Medication Sig Dispense Refill  ? aspirin EC 81 MG tablet Take 81 mg by mouth daily.    ? BLACK COHOSH PO Take 1 tablet by mouth 2 (two) times daily. 40mg     ? citalopram (CELEXA) 20 MG tablet Take 20 mg by mouth daily.    ? clopidogrel (PLAVIX) 75 MG tablet Take 75 mg by mouth daily.    ? EUTHYROX 25 MCG tablet TAKE 1 TABLET BY MOUTH ONCE DAILY IN THE MORNING    ? gabapentin (NEURONTIN) 300 MG capsule Take 900 mg by mouth at bedtime.     ? insulin aspart (NOVOLOG) 100 UNIT/ML injection Inject 4-8 Units into the skin 3 (three) times daily with meals. Sliding scale :cbg 200, gives 4 units. cbg 250 6 units    ? insulin NPH-regular Human (NOVOLIN 70/30) (70-30) 100 UNIT/ML injection 8 units every morning & 4 units every evening    ? methadone (DOLOPHINE) 10 MG tablet Take 10 mg by mouth every 8 (eight) hours.    ? pantoprazole (PROTONIX) 40 MG tablet Take 40 mg by mouth daily.    ? pindolol (VISKEN) 5 MG tablet Take 1 tablet (5 mg total) by mouth 2 (two) times daily. 180 tablet 1  ? raloxifene (EVISTA) 60 MG tablet Take  80 mg by mouth daily.    ? simvastatin (ZOCOR) 10 MG tablet Take 10 mg by mouth daily.  90 tablet 0  ? nitroGLYCERIN (NITROSTAT) 0.4 MG SL tablet Place 1 tablet (0.4 mg total) under the tongue every 5 (five) minutes x 3 doses as needed for chest pain (if no relief after 2nd dose, proceed to ED or call 911). 25 tablet 3  ? ?No current facility-administered medications for this visit.  ? ?Allergies:  Ampicillin  ? ?ROS: No orthopnea or PND. ? ?Physical Exam: ?VS:  BP (!) 148/70   Pulse 65   Ht 5\' 7"  (1.702 m)   Wt 127 lb 12.8 oz (58 kg)   SpO2 96%   BMI 20.02 kg/m? , BMI Body mass index is 20.02 kg/m?. ? ?Wt Readings from Last 3 Encounters:  ?01/31/22 127 lb 12.8 oz (58 kg)  ?12/19/20 139 lb 12.8 oz (63.4 kg)  ?03/17/20 134 lb (60.8 kg)  ?  ?General: Patient appears comfortable at rest. ?HEENT:  Conjunctiva and lids normal. ?Neck: Supple, no elevated JVP or carotid bruits, no thyromegaly. ?Lungs: Clear to auscultation, nonlabored breathing at rest. ?Cardiac: Regular rate and rhythm, no S3 or significant systolic murmur, no pericardial rub. ?Extremities: No pitting edema. ? ?ECG:  An ECG dated 03/17/2020 was personally reviewed today and demonstrated:  Sinus bradycardia with possible left atrial enlargement. ? ?Recent Labwork: ? ?September 2021: TSH 3.81, BUN 18, creatinine 1.24, potassium 5.1, AST 26, ALT 16, cholesterol 160, triglycerides 77, HDL 78, LDL 67 ?March 2022: Hemoglobin A1c 6.8%, hemoglobin 13.1, platelets 243 ? ?Other Studies Reviewed Today: ? ?Lexiscan Cardiolite 12/15/2014: ?FINDINGS: ?Pharmacological stress ?  ?Baseline EKG shows normal sinus rhythm. After injection heart rate ?increased from 51 beats per min up to 102 beats per min, and blood ?pressure decreased from 120/69 to 110/68. The test was stopped after ?injection was complete. The patient did have some chest pain after ?injection. Post-injection EKG shows inferior downsloping ST ?depressions of 2 mm and 1 mm downsloping ST depressions in the ?lateral precordial leads. ?  ?Perfusion: There is a small mild intensity fixed basal inferoseptal ?wall defect. There is normal wall motion of this segment. There is ?noted radiotracer uptake in the gut adjacent to this wall segment. ?Findings likely consistent with artifact. There are no other ?perfusion defects. ?  ?Wall Motion: Normal left ventricular wall motion. No left ?ventricular dilation. ?  ?Left Ventricular Ejection Fraction: 63 % ?  ?End diastolic volume 63 ml ?  ?End systolic volume 23 ml ? ?Assessment and Plan: ? ?1.  CAD status post DES to the RCA in 2008 and again in 2010.  She is doing well without active angina and maintaining a regular exercise plan by walking.  ECG is normal today.  Continue aspirin, Plavix, pindolol, Zocor, and as needed nitroglycerin which will be  refilled. ? ?2.  History of ectopic atrial tachycardia.  No significant palpitations. ? ?3.  Mixed hyperlipidemia on Zocor.  Requesting interval lab work from PCP. ? ?Medication Adjustments/Labs and Tests Ordered: ?Current medicines are reviewed at length with the patient today.  Concerns regarding medicines are outlined above.  ? ?Tests Ordered: ?Orders Placed This Encounter  ?Procedures  ? EKG 12-Lead  ? ? ?Medication Changes: ?Meds ordered this encounter  ?Medications  ? nitroGLYCERIN (NITROSTAT) 0.4 MG SL tablet  ?  Sig: Place 1 tablet (0.4 mg total) under the tongue every 5 (five) minutes x 3 doses as needed for chest pain (if no relief after  2nd dose, proceed to ED or call 911).  ?  Dispense:  25 tablet  ?  Refill:  3  ? ? ?Disposition:  Follow up  1 year. ? ?Signed, ?Haley SidleSamuel G. Jaemarie Hochberg, MD, [facility] Medical CenterFACC ?01/31/2022 2:33 PM    ?[facility] ?[address], [place], [place] [address] ?Phone: [phone]; Fax: [phone]  ?

## 2022-05-06 ENCOUNTER — Other Ambulatory Visit: Payer: Self-pay | Admitting: Cardiology

## 2022-07-17 ENCOUNTER — Ambulatory Visit: Payer: Medicaid Other | Admitting: Podiatry

## 2022-07-17 DIAGNOSIS — L600 Ingrowing nail: Secondary | ICD-10-CM | POA: Diagnosis not present

## 2022-07-17 DIAGNOSIS — E119 Type 2 diabetes mellitus without complications: Secondary | ICD-10-CM | POA: Diagnosis not present

## 2022-07-17 NOTE — Progress Notes (Signed)
   Chief Complaint  Patient presents with   diabetic foot care    [facility] plus Nail trim     Subjective: Patient presents today for evaluation of pain to the lateral border of the right great toenail plate. Patient is concerned for possible ingrown nail.  It is very sensitive to touch.  Patient dates that about 1 year ago she was chopping wood and had an injury where a piece of wood came down on her right great toenail.  It has been painful and tender ever since.  Her PCP also sent her in today for routine diabetic foot evaluation.  Patient presents today for further treatment and evaluation.  Past Medical History:  Diagnosis Date   Anxiety    Atrial tachycardia (HCC)    Noninducible at EP study   Chronic pain syndrome    Coronary atherosclerosis of native coronary artery    DES to mid RCA 01/2007 and DES to mid RCA 01/2009; LVEF normal   Depression    Essential hypertension    GERD (gastroesophageal reflux disease)    Hyperlipidemia    Mononeuritis multiplex    Nephrolithiasis    Pneumonia    Pulmonary nodules    Seizures (HCC)    Type 2 diabetes mellitus ([place])     Objective:  General: Well developed, nourished, in no acute distress, alert and oriented x3   Dermatology: Skin is warm, dry and supple bilateral.  Lateral border of the right great toe is tender with evidence of an ingrowing nail. Pain on palpation noted to the border of the nail fold. The remaining nails appear unremarkable at this time. There are no open sores, lesions.  Vascular: DP and PT pulses palpable.  No clinical evidence of vascular compromise  Neruologic: Grossly intact via light touch bilateral.  Musculoskeletal: No pedal deformity noted  Assesement: #1 Paronychia with ingrowing nail lateral border right great toe #2 encounter for diabetic foot exam  Plan of Care:  1. Patient evaluated.  2. Discussed treatment alternatives and plan of care. Explained nail avulsion procedure and post procedure course  to patient. 3. Patient opted for permanent partial nail avulsion of the ingrown portion of the nail.  4. Prior to procedure, local anesthesia infiltration utilized using 3 ml of a 50:50 mixture of 2% plain lidocaine and 0.5% plain marcaine in a normal hallux block fashion and a betadine prep performed.  5. Partial temporary nail avulsion was performed of the offending nail border.   6. Light dressing applied.  Post care instructions provided 7.  Recommend triple antibiotic ointment and a light Band-Aid daily 8.  Return to clinic 2 weeks.  Edrick Kins, DPM [facility]  Dr. Edrick Kins, DPM    [address], [place] [address]                Office [phone]  Fax [phone]

## 2023-01-15 ENCOUNTER — Other Ambulatory Visit: Payer: Self-pay | Admitting: Internal Medicine

## 2023-01-15 DIAGNOSIS — Z1231 Encounter for screening mammogram for malignant neoplasm of breast: Secondary | ICD-10-CM

## 2023-01-29 ENCOUNTER — Ambulatory Visit
Admission: RE | Admit: 2023-01-29 | Discharge: 2023-01-29 | Disposition: A | Discharge status: Home / Self Care | Payer: Medicaid Other | Source: Ambulatory Visit | Attending: Internal Medicine | Admitting: Internal Medicine

## 2023-01-29 DIAGNOSIS — Z1231 Encounter for screening mammogram for malignant neoplasm of breast: Secondary | ICD-10-CM

## 2023-02-12 ENCOUNTER — Other Ambulatory Visit: Payer: Self-pay | Admitting: Cardiology

## 2023-03-18 ENCOUNTER — Other Ambulatory Visit: Payer: Self-pay | Admitting: Cardiology

## 2023-03-24 ENCOUNTER — Other Ambulatory Visit: Payer: Self-pay | Admitting: Cardiology

## 2023-05-08 ENCOUNTER — Other Ambulatory Visit: Payer: Self-pay | Admitting: Cardiology

## 2023-07-21 ENCOUNTER — Other Ambulatory Visit: Payer: Self-pay | Admitting: Cardiology

## 2023-08-10 ENCOUNTER — Other Ambulatory Visit: Payer: Self-pay | Admitting: Cardiology

## 2023-08-19 ENCOUNTER — Other Ambulatory Visit: Payer: Self-pay | Admitting: Cardiology

## 2023-09-04 ENCOUNTER — Other Ambulatory Visit: Payer: Self-pay | Admitting: Cardiology

## 2023-09-06 ENCOUNTER — Other Ambulatory Visit: Payer: Self-pay | Admitting: Cardiology

## 2023-09-23 ENCOUNTER — Ambulatory Visit: Payer: Medicaid Other | Attending: Cardiology | Admitting: Cardiology

## 2023-09-23 ENCOUNTER — Encounter: Payer: Self-pay | Admitting: Cardiology

## 2023-09-23 VITALS — BP 122/58 | HR 60 | Ht 67.0 in | Wt 116.6 lb

## 2023-09-23 DIAGNOSIS — I25119 Atherosclerotic heart disease of native coronary artery with unspecified angina pectoris: Secondary | ICD-10-CM

## 2023-09-23 DIAGNOSIS — I10 Essential (primary) hypertension: Secondary | ICD-10-CM

## 2023-09-23 DIAGNOSIS — E782 Mixed hyperlipidemia: Secondary | ICD-10-CM

## 2023-09-23 DIAGNOSIS — I4719 Other supraventricular tachycardia: Secondary | ICD-10-CM | POA: Diagnosis not present

## 2023-09-23 MED ORDER — NITROGLYCERIN 0.4 MG SL SUBL: 0.4000 mg | SUBLINGUAL_TABLET | SUBLINGUAL | 3 refills | Status: AC | PRN

## 2023-09-23 NOTE — Patient Instructions (Addendum)

## 2023-09-23 NOTE — Progress Notes (Signed)
    Cardiology Office Note  Date: 09/23/2023   ID: EARA BURRUEL, DOB 10/28/1960, MRN [id number]  History of Present Illness: Haley [name] is a 63 y.o. female last seen in May 2023.  She is here for a follow-up visit.  Reports no significant palpitations or angina, no interval nitroglycerin  use.  She walks about a mile each day with her husband.  No syncope.  I reviewed her medications.  Current cardiovascular regimen includes aspirin , Plavix, pindolol , Zocor, and as needed nitroglycerin .  We discussed getting a refill for a fresh bottle of nitroglycerin .  I did review her interval lab work, LDL was 61 in March 2024.  ECG today shows an ectopic atrial rhythm at 60 bpm with nonspecific T wave changes.  Physical Exam: VS:  BP (!) 122/58   Pulse 60   Ht 5' 7 (1.702 m)   Wt 116 lb 9.6 oz (52.9 kg)   SpO2 92%   BMI 18.26 kg/m , BMI Body mass index is 18.26 kg/m.  Wt Readings from Last 3 Encounters:  09/23/23 116 lb 9.6 oz (52.9 kg)  01/31/22 127 lb 12.8 oz (58 kg)  12/19/20 139 lb 12.8 oz (63.4 kg)    General: Patient appears comfortable at rest. HEENT: Conjunctiva and lids normal. Neck: Supple, no elevated JVP or carotid bruits. Lungs: Clear to auscultation, nonlabored breathing at rest. Cardiac: Regular rate and rhythm, no S3 or significant systolic murmur, no pericardial rub.  ECG:  An ECG dated 01/31/2022 was personally reviewed today and demonstrated:  Sinus rhythm.  Labwork:  March 2024: Hemoglobin 12.9, platelets 216, BUN 23, creatinine 1.2, potassium 4.7, AST 64, ALT 51, cholesterol 161, triglycerides 52, HDL 89, LDL 61, TSH 1.69  Other Studies Reviewed Today:  No interval cardiac testing for review today.  Assessment and Plan:  1.  CAD status post DES to the mid RCA in 2008 as well as DES to the mid RCA in 2010.  She is symptomatically stable without increasing angina or nitroglycerin  use.  Refill provided for fresh bottle of nitroglycerin .  Continue aspirin ,  Plavix, and Zocor.  2.  Primary hypertension.  Blood pressure is well-controlled today.  3.  Mixed hyperlipidemia.  LDL 61 in March 2024.  Continue Zocor.  4.  History of atrial tachycardia.  No significant palpitations.  Continue pindolol .  Disposition:  Follow up  1 year, sooner if needed.  Signed, Haley [name], M.D., F.A.C.C.  HeartCare at [facility]

## 2023-10-06 ENCOUNTER — Other Ambulatory Visit: Payer: Self-pay | Admitting: Cardiology

## 2023-10-07 ENCOUNTER — Telehealth: Payer: Self-pay | Admitting: Cardiology

## 2023-10-07 NOTE — Telephone Encounter (Signed)
  Pt c/o medication issue:  1. Name of Medication:   pindolol (VISKEN) 5 MG tablet    2. How are you currently taking this medication (dosage and times per day)?   Take 1 tablet (5 mg total) by mouth daily.    3. Are you having a reaction (difficulty breathing--STAT)? No   4. What is your medication issue? Pt stated that she has been taking this medication at a dose of 5 mg twice a day, for a total of 10 mg daily. However, when she picked up the medication at the pharmacy, she noticed the instructions now say to take 5 mg daily. She would like to verify if Dr. Diona Browner changed the dosage during her last visit, as she is not aware of any change.

## 2023-10-08 ENCOUNTER — Telehealth: Payer: Self-pay | Admitting: Cardiology

## 2023-10-08 MED ORDER — PINDOLOL 5 MG PO TABS: 5.0000 mg | ORAL_TABLET | Freq: Two times a day (BID) | ORAL | 1 refills | Status: DC

## 2023-10-08 NOTE — Telephone Encounter (Signed)
Pt c/o medication issue:  1. Name of Medication:   pindolol (VISKEN) 5 MG tablet    2. How are you currently taking this medication (dosage and times per day)? Take 1 tablet (5 mg total) by mouth 2 (two) times daily.   3. Are you having a reaction (difficulty breathing--STAT)? no  4. What is your medication issue? Pt picked up bottle at the pharmacy and it says to take 1 tablet daily instead of 1 tablet twice daily. Pleas advise

## 2023-10-08 NOTE — Telephone Encounter (Signed)
Advised patient prescription and message was sent to pharmacy yesterday for the correct dosing. Advised patient if the pharmacy gives her any trouble to have them call our office

## 2023-10-08 NOTE — Telephone Encounter (Signed)
Left detailed voicemail regarding this.

## 2023-10-08 NOTE — Telephone Encounter (Signed)
Previous prescription sent in error. Updated correct prescription has been sent to pharmacy. Will call and update patient after 8 am

## 2024-06-03 ENCOUNTER — Encounter (INDEPENDENT_AMBULATORY_CARE_PROVIDER_SITE_OTHER): Payer: Self-pay | Admitting: *Deleted

## 2024-06-17 ENCOUNTER — Ambulatory Visit (INDEPENDENT_AMBULATORY_CARE_PROVIDER_SITE_OTHER): Admitting: Gastroenterology

## 2024-06-17 ENCOUNTER — Encounter (INDEPENDENT_AMBULATORY_CARE_PROVIDER_SITE_OTHER): Payer: Self-pay | Admitting: Gastroenterology

## 2024-06-17 ENCOUNTER — Telehealth (INDEPENDENT_AMBULATORY_CARE_PROVIDER_SITE_OTHER): Payer: Self-pay

## 2024-06-17 VITALS — BP 148/66 | HR 51 | Temp 97.2°F | Ht 67.0 in | Wt 103.6 lb

## 2024-06-17 DIAGNOSIS — R634 Abnormal weight loss: Secondary | ICD-10-CM | POA: Diagnosis not present

## 2024-06-17 DIAGNOSIS — R1319 Other dysphagia: Secondary | ICD-10-CM | POA: Diagnosis not present

## 2024-06-17 DIAGNOSIS — R1310 Dysphagia, unspecified: Secondary | ICD-10-CM | POA: Insufficient documentation

## 2024-06-17 MED ORDER — NYSTATIN 100000 UNIT/ML MT SUSP: 15.0000 mL | Freq: Three times a day (TID) | OROMUCOSAL | 1 refills | Status: AC

## 2024-06-17 MED ORDER — PANTOPRAZOLE SODIUM 40 MG PO TBEC: 40.0000 mg | DELAYED_RELEASE_TABLET | Freq: Every day | ORAL | 3 refills | Status: AC

## 2024-06-17 NOTE — Telephone Encounter (Signed)
   Name: Haley [name]  DOB: 1961/06/29  MRN: [id number]   Primary Cardiologist: Jayson Sierras, MD  Chart reviewed as part of pre-operative protocol coverage.   We have been asked for guidance to hold plavix. Pt has a history of remote stenting in the RCA. Last note indicates she is on ASA and plavix. We recommend plavix hold of 5-7 days with continuation of ASA during that time. After procedure, pt may resume plavix and continue ASA.   I will route this recommendation to the requesting party via Epic fax function and remove from pre-op pool. Please call with questions.  Jon Garre Bedelia Pong, PA 06/17/2024, 3:10 PM

## 2024-06-17 NOTE — Patient Instructions (Signed)
 It was very nice to meet you today, as dicussed with will plan for the following :  1) Upper endoscopy and colonoscopy

## 2024-06-17 NOTE — Telephone Encounter (Signed)
    06/17/24  Haley [name] [birth year]  What type of surgery is being performed? EGD, Dilation and colonoscopy  When is surgery scheduled? TBD  What type of clearance is required (medical or pharmacy to hold medication or both? medication  Are there any medications that need to be held prior to surgery and how long? Plavix, hold 5 days prior to procedure  Name of physician performing surgery?  Dr. Cinderella Rouse [facility] Phone: [phone] Fax: [phone]  Anethesia type (none, local, MAC, general)? MAC     ? Yes ? No Patient can hold medication as requested   Signature: ___________________________

## 2024-06-17 NOTE — Progress Notes (Signed)
 Marliyah Reid Faizan Romona Murdy , M.D. Gastroenterology & Hepatology [facility] Gastroenterology [address] [place], [place] [address] Primary Care Physician: Rosamond Leta NOVAK, MD [address] [place] [address]  Chief Complaint: Dysphagia, unintentional weight loss  History of Present Illness: Haley [name] is a 63 y.o. female with diabetes on insulin  ,chronic pain on methadone and gabapentin , Cad with stents on Plavix who presents for evaluation of/odynophagia unintentional weight loss  Patient is accompanied with daughter.  Reports for past 2 months she is having increasing difficulty swallowing with significant pain whenever patient swallows.  She reports as food is getting hung with feeling the throat and middle of her chest.  Patient denies any NSAID use takes methadone.  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  Patient continues to have weight loss which is unintentional.  Never had upper endoscopy or colonoscopy  Labs from 04/28/2023 hemoglobin 12.9 platelet 216 creatinine 1.20 elevated liver enzymes ALP 137 AST 64 ALT 50 04/25 normal hemoglobin 12 platelets 202  ALT 44 AST 37 alk phos 118 The patient denies having any nausea, vomiting, fever, chills, hematochezia, melena, hematemesis, abdominal distention, abdominal pain, diarrhea, jaundice, pruritus or weight loss.  Last ZHI:wnwz Last Colonoscopy: none  FHx: neg for any gastrointestinal/liver disease, no malignancies Social: neg smoking, alcohol or illicit drug use Surgical: Cholecystectomy, hysterectomy,  Past Medical History: Past Medical History:  Diagnosis Date   Anxiety    Atrial tachycardia    Noninducible at EP study   Chronic pain syndrome    Coronary atherosclerosis of native coronary artery    DES to mid RCA 01/2007 and DES to mid RCA 01/2009; LVEF normal    Depression    Essential hypertension    GERD (gastroesophageal reflux disease)    Hyperlipidemia    Mononeuritis multiplex    Nephrolithiasis    Pneumonia    Pulmonary nodules    Seizures (HCC)    Type 2 diabetes mellitus (HCC)     Past Surgical History: Past Surgical History:  Procedure Laterality Date   ABDOMINAL HYSTERECTOMY     total   CHOLECYSTECTOMY     LEFT HEART CATHETERIZATION WITH CORONARY ANGIOGRAM N/A 12/26/2011   Procedure: LEFT HEART CATHETERIZATION WITH CORONARY ANGIOGRAM;  Surgeon: Ozell Fell, MD;  Location: [facility] CATH LAB;  Service: Cardiovascular;  Laterality: N/A;   LEFT HEART CATHETERIZATION WITH CORONARY ANGIOGRAM N/A 01/08/2013   Procedure: LEFT HEART CATHETERIZATION WITH CORONARY ANGIOGRAM;  Surgeon: Maude JAYSON Emmer, MD;  Location: [facility] CATH LAB;  Service: Cardiovascular;  Laterality: N/A;   Right knee surgery      Family History: Family History  Problem Relation Age of Onset   Heart attack Mother    COPD Father    Breast cancer Neg Hx     Social History: Social History   Tobacco Use  Smoking Status Never  Smokeless Tobacco Never   Social History   Substance and Sexual Activity  Alcohol Use No   Alcohol/week: 0.0 standard drinks of alcohol   Social History   Substance and Sexual Activity  Drug Use No    Allergies: Allergies  Allergen Reactions   Ampicillin Other (See Comments)    Black out    Medications: Current Outpatient Medications  Medication Sig Dispense Refill   aspirin  EC 81 MG tablet Take 81 mg by mouth daily.  BLACK COHOSH PO Take 1 tablet by mouth 2 (two) times daily. 40mg      citalopram (CELEXA) 20 MG tablet Take 20 mg by mouth daily.     clopidogrel (PLAVIX) 75 MG tablet Take 75 mg by mouth daily.     EUTHYROX 25 MCG tablet TAKE 1 TABLET BY MOUTH ONCE DAILY IN THE MORNING     gabapentin (NEURONTIN) 300 MG capsule Take 900 mg by mouth at bedtime.      insulin  aspart (NOVOLOG ) 100 UNIT/ML injection Inject 4-8 Units  into the skin 3 (three) times daily with meals. Sliding scale :cbg 200, gives 4 units. cbg 250 6 units     insulin  NPH-regular Human (NOVOLIN 70/30) (70-30) 100 UNIT/ML injection 8 units every morning & 4 units every evening     methadone (DOLOPHINE) 10 MG tablet Take 10 mg by mouth every 8 (eight) hours.     nitroGLYCERIN  (NITROSTAT ) 0.4 MG SL tablet Place 1 tablet (0.4 mg total) under the tongue every 5 (five) minutes x 3 doses as needed for chest pain (if no relief after 2nd dose, proceed to ED or call 911). 25 tablet 3   pantoprazole (PROTONIX) 40 MG tablet Take 40 mg by mouth daily.     pindolol  (VISKEN ) 5 MG tablet Take 1 tablet (5 mg total) by mouth 2 (two) times daily. 180 tablet 1   raloxifene (EVISTA) 60 MG tablet Take 80 mg by mouth daily.     simvastatin (ZOCOR) 10 MG tablet Take 10 mg by mouth daily.  90 tablet 0   No current facility-administered medications for this visit.    Review of Systems: GENERAL: negative for malaise, night sweats HEENT: No changes in hearing or vision, no nose bleeds or other nasal problems. NECK: Negative for lumps, goiter, pain and significant neck swelling RESPIRATORY: Negative for cough, wheezing CARDIOVASCULAR: Negative for chest pain, leg swelling, palpitations, orthopnea GI: SEE HPI MUSCULOSKELETAL: Negative for joint pain or swelling, back pain, and muscle pain. SKIN: Negative for lesions, rash HEMATOLOGY Negative for prolonged bleeding, bruising easily, and swollen nodes. ENDOCRINE: Negative for cold or heat intolerance, polyuria, polydipsia and goiter. NEURO: negative for tremor, gait imbalance, syncope and seizures. The remainder of the review of systems is noncontributory.   Physical Exam: BP (!) 148/66   Pulse (!) 51   Temp (!) 97.2 F (36.2 C)   Ht 5' 7 (1.702 m)   Wt 103 lb 9.6 oz (47 kg)   BMI 16.23 kg/m  GENERAL: The patient is AO x3, in no acute distress. HEENT: Head is normocephalic and atraumatic. EOMI are intact. Mouth  is well hydrated and without lesions. NECK: Supple. No masses LUNGS: Clear to auscultation. No presence of rhonchi/wheezing/rales. Adequate chest expansion HEART: RRR, normal s1 and s2. ABDOMEN: Soft, nontender, no guarding, no peritoneal signs, and nondistended. BS +. No masses.  Imaging/Labs: as above     Latest Ref Rng & Units 01/08/2013    6:20 AM 04/10/2010   10:48 PM 07/22/2009    4:30 AM  CBC  WBC 4.0 - 10.5 K/uL 5.2  6.9  5.1   Hemoglobin 12.0 - 15.0 g/dL 86.5  87.0  87.0   Hematocrit 36.0 - 46.0 % 38.9  38.0  37.4   Platelets 150 - 400 K/uL 202  172  159    No results found for: IRON, TIBC, FERRITIN  I personally reviewed and interpreted the available labs, imaging and endoscopic files.  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  No Zenker's diverticulum is evident.   Normal double contrast esophagram otherwise.   When the patient swallowed the 13 mm barium sulfate tablet, the  tablet passed through the esophagus into the stomach without  evidence of stricture or area of significant narrowing.   Impression and Plan:  NARELY NOBLES is a 63 y.o. female with diabetes on insulin  ,chronic pain on methadone and gabapentin , Cad with stents on Plavix who presents for evaluation of/odynophagia unintentional weight loss  #Dysphagia /Odynophagia #Unintentional weight loss  Patient has worsening dysphagia along odynophagia.  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  Will obtain clearance from cardiology about risk  stratification for the procedure and holding Plavix for 5 days.  Can continue aspirin  if needed  I thoroughly discussed with the patient the procedure, including the risks involved. Patient understands what the procedure involves including the benefits and any risks. Patient understands alternatives to the proposed procedure. Risks including (but not limited to) bleeding, tearing of the lining (perforation), rupture of adjacent organs, problems with heart and lung function, infection, and medication reactions. A small percentage of complications may require surgery, hospitalization, repeat endoscopic procedure, and/or transfusion.  Patient understood and agreed.   If above does not explain cause of unintentional weight loss may pursue cross-sectional imaging in future   All questions were answered.      Ezri Landers Faizan Elizabelle Fite, MD Gastroenterology and Hepatology [facility] Gastroenterology   This chart has been completed using [facility] Dictation software, and while attempts have been made to ensure accuracy , certain words and phrases may not be transcribed as intended

## 2024-06-19 NOTE — Telephone Encounter (Signed)
 Proceed with scheduling, continue aspirin 

## 2024-06-24 MED ORDER — PEG 3350-KCL-NA BICARB-NACL 420 G PO SOLR: 4000.0000 mL | Freq: Once | ORAL | 0 refills | Status: AC

## 2024-06-24 NOTE — Addendum Note (Signed)
 Addended by: DALLIE LIONEL RAMAN on: 06/24/2024 11:47 AM   Modules accepted: Orders

## 2024-06-24 NOTE — Telephone Encounter (Signed)
 Spoke with patient, scheduled TCS,EGD,DIL for 07/21/2024 at 11:00am. Rx sent to pharmacy. Instructions sent through the mail.

## 2024-06-25 NOTE — Telephone Encounter (Signed)
 ATC patient, no answer. LVM with pre-op appt details.

## 2024-07-15 ENCOUNTER — Other Ambulatory Visit: Payer: Self-pay | Admitting: Cardiology

## 2024-07-16 ENCOUNTER — Encounter (HOSPITAL_COMMUNITY)
Admission: RE | Admit: 2024-07-16 | Discharge: 2024-07-16 | Disposition: A | Discharge status: Home / Self Care | Source: Ambulatory Visit | Attending: Gastroenterology | Admitting: Gastroenterology

## 2024-07-21 ENCOUNTER — Telehealth: Payer: Self-pay | Admitting: *Deleted

## 2024-07-21 ENCOUNTER — Encounter (HOSPITAL_COMMUNITY): Admission: RE | Payer: Self-pay | Source: Home / Self Care

## 2024-07-21 ENCOUNTER — Ambulatory Visit (HOSPITAL_COMMUNITY): Admission: RE | Admit: 2024-07-21 | Source: Home / Self Care | Admitting: Gastroenterology

## 2024-07-21 SURGERY — COLONOSCOPY
Anesthesia: Choice

## 2024-07-21 NOTE — Telephone Encounter (Signed)
 Ahmed, Deatrice FALCON, MD  Jeanell Graeme RAMAN, CMA; [place], [place], [place] Patient was not able to drink liquid prep and could not show for procedure today  Please reschedule directly , can give SUTABs and Zofran  tablets  Need to hold Plavix as directed earlier and cleared by cardiology

## 2024-07-21 NOTE — Telephone Encounter (Signed)
 Per Endo:  patient called stated been up all night sick, vomiting and diarrhea. Only was able to consume half the prep . Dr. Cinderella notified and patient told to call office to reschedule. Stated her stool was brown and loose. She was told to call you guys and reschedule.

## 2024-07-22 NOTE — Telephone Encounter (Signed)
 LMOVM to call back

## 2024-07-28 NOTE — Telephone Encounter (Signed)
 LMOVM to call back. Letter mailed. ?

## 2024-08-12 ENCOUNTER — Encounter (INDEPENDENT_AMBULATORY_CARE_PROVIDER_SITE_OTHER): Payer: Self-pay | Admitting: Gastroenterology

## 2024-10-21 ENCOUNTER — Other Ambulatory Visit: Payer: Self-pay | Admitting: Cardiology
